# Patient Record
Sex: Female | Born: 1960 | Marital: Married | Smoking: Never smoker
Health system: Southern US, Community
[De-identification: ages and names within clinical notes are randomized; demographics above are authoritative.]

## PROBLEM LIST (undated history)

## (undated) DIAGNOSIS — M5416 Radiculopathy, lumbar region: Secondary | ICD-10-CM

## (undated) DIAGNOSIS — K5909 Other constipation: Secondary | ICD-10-CM

## (undated) DIAGNOSIS — M1990 Unspecified osteoarthritis, unspecified site: Secondary | ICD-10-CM

## (undated) DIAGNOSIS — M5136 Other intervertebral disc degeneration, lumbar region: Secondary | ICD-10-CM

## (undated) DIAGNOSIS — M810 Age-related osteoporosis without current pathological fracture: Secondary | ICD-10-CM

## (undated) DIAGNOSIS — F5104 Psychophysiologic insomnia: Secondary | ICD-10-CM

## (undated) DIAGNOSIS — M51369 Other intervertebral disc degeneration, lumbar region without mention of lumbar back pain or lower extremity pain: Secondary | ICD-10-CM

## (undated) DIAGNOSIS — E785 Hyperlipidemia, unspecified: Secondary | ICD-10-CM

## (undated) DIAGNOSIS — U071 COVID-19: Secondary | ICD-10-CM

## (undated) DIAGNOSIS — R768 Other specified abnormal immunological findings in serum: Secondary | ICD-10-CM

## (undated) DIAGNOSIS — K219 Gastro-esophageal reflux disease without esophagitis: Secondary | ICD-10-CM

## (undated) DIAGNOSIS — M5126 Other intervertebral disc displacement, lumbar region: Secondary | ICD-10-CM

## (undated) DIAGNOSIS — R059 Cough, unspecified: Secondary | ICD-10-CM

## (undated) DIAGNOSIS — N200 Calculus of kidney: Secondary | ICD-10-CM

## (undated) DIAGNOSIS — Z86010 Personal history of colonic polyps: Secondary | ICD-10-CM

## (undated) DIAGNOSIS — R053 Chronic cough: Secondary | ICD-10-CM

## (undated) HISTORY — DX: COVID-19: U07.1

## (undated) HISTORY — PX: UPPER GASTROINTESTINAL ENDOSCOPY: SHX188

## (undated) HISTORY — DX: Unspecified osteoarthritis, unspecified site: M19.90

## (undated) HISTORY — DX: Other specified abnormal immunological findings in serum: R76.8

## (undated) HISTORY — DX: Cough, unspecified: R05.9

## (undated) HISTORY — PX: MOUTH SURGERY: SHX715

## (undated) HISTORY — DX: Other intervertebral disc displacement, lumbar region: M51.26

## (undated) HISTORY — PX: WISDOM TOOTH EXTRACTION: SHX21

## (undated) HISTORY — DX: Other constipation: K59.09

## (undated) HISTORY — PX: TONSILLECTOMY: SUR1361

## (undated) HISTORY — DX: Chronic cough: R05.3

## (undated) HISTORY — DX: Other intervertebral disc degeneration, lumbar region: M51.36

## (undated) HISTORY — DX: Personal history of colonic polyps: Z86.010

## (undated) HISTORY — DX: Radiculopathy, lumbar region: M54.16

## (undated) HISTORY — PX: BREAST ENHANCEMENT SURGERY: SHX7

## (undated) HISTORY — DX: Gastro-esophageal reflux disease without esophagitis: K21.9

## (undated) HISTORY — DX: Age-related osteoporosis without current pathological fracture: M81.0

## (undated) HISTORY — DX: Hyperlipidemia, unspecified: E78.5

## (undated) HISTORY — DX: Other intervertebral disc degeneration, lumbar region without mention of lumbar back pain or lower extremity pain: M51.369

## (undated) HISTORY — DX: Psychophysiologic insomnia: F51.04

## (undated) HISTORY — DX: Calculus of kidney: N20.0

## (undated) HISTORY — PX: OTHER SURGICAL HISTORY: SHX169

---

## 2007-05-27 HISTORY — PX: BREAST ENHANCEMENT SURGERY: SHX7

## 2009-05-26 HISTORY — PX: VULVA SURGERY: SHX837

## 2012-07-22 ENCOUNTER — Encounter: Payer: Self-pay | Admitting: Internal Medicine

## 2012-08-27 ENCOUNTER — Encounter: Payer: Self-pay | Admitting: Internal Medicine

## 2014-02-27 ENCOUNTER — Encounter: Payer: Self-pay | Admitting: Internal Medicine

## 2014-04-27 ENCOUNTER — Encounter: Payer: Self-pay | Admitting: Internal Medicine

## 2014-04-27 ENCOUNTER — Ambulatory Visit (INDEPENDENT_AMBULATORY_CARE_PROVIDER_SITE_OTHER): Payer: BC Managed Care – PPO | Admitting: Internal Medicine

## 2014-04-27 VITALS — BP 120/72 | HR 72 | Ht 62.5 in | Wt 119.4 lb

## 2014-04-27 DIAGNOSIS — K5900 Constipation, unspecified: Secondary | ICD-10-CM

## 2014-04-27 DIAGNOSIS — Z1211 Encounter for screening for malignant neoplasm of colon: Secondary | ICD-10-CM

## 2014-04-27 DIAGNOSIS — K5909 Other constipation: Secondary | ICD-10-CM

## 2014-04-27 DIAGNOSIS — K6289 Other specified diseases of anus and rectum: Secondary | ICD-10-CM

## 2014-04-27 MED ORDER — MOVIPREP 100 G PO SOLR: ORAL | Status: DC

## 2014-04-27 MED ORDER — LINACLOTIDE 145 MCG PO CAPS: 145.0000 ug | ORAL_CAPSULE | Freq: Every day | ORAL | Status: DC

## 2014-04-27 NOTE — Progress Notes (Signed)
Patient ID: Cassandra Frazier, female   DOB: 1961/04/26, 53 y.o.   MRN: [id number] Faxed office note from today to [facility] in [place], fax # [phone], phone # [phone] per Dr Celesta Aver instructions.

## 2014-04-27 NOTE — Progress Notes (Signed)
Referred by Jeneen Rinks Daily, MD Subjective:    Patient ID: Cassandra Frazier, female    DOB: [birth year], 53 y.o.   MRN: [id number]  HPI Patient is a very nice middle-aged woman here because of constipation issues. She has had chronic constipation issues for many years. She says when she was a child she had diarrhea or colitis but then after the birth of her only son in 1992 she developed constipation problems. Over the years she generally got an urge to defecate but had difficulty producing a stool. She used various laxatives over time. In the late summer or fall she developed problems with a herniated disc, she was on narcotics, she underwent epidural injections which helped her disc pain but had a flare of constipation problems. She had x-rays that demonstrated a large amount of stool and it was suggested she seek GI consultation regarding her problems. Since that time she is off all narcotics but continues to use a regimen of MiraLAX nightly, senna O with stool softener and Colace at night also and then use anywhere from 15-25 glycerin suppositories to promote defecation in the morning. Stools are mushy and there is incomplete defecation sensation. Enemas do not tend to help her. When she was having problems in the fall while she was on narcotics she had a soapsuds enema at the hospital that really did not help and then took 4 L of GoLYTELY which produced some defecation but did not really am to her cause any problems either. She relates that she had a fourth degree laceration with the birth of her son and has had problems with urge incontinence at times since then and always has to be cognizant of where the bathroom is to avoid that. She travels a lot with her work out in the car and wants to avoid having to defecate at those times. She does not have any urinary symptoms. No back pain reported now. No Known Allergies No outpatient prescriptions prior to visit.   No facility-administered medications prior to  visit.   Past Medical History  Diagnosis Date  . Herniated lumbar intervertebral disc   . Acute right lumbar radiculopathy   . Degeneration of lumbar intervertebral disc   . Kidney stones   . Arthritis    Past Surgical History  Procedure Laterality Date  . Mouth surgery    . Epidural steroid injection      multiple  . Tonsillectomy    . Breast enhancement surgery     History   Social History  . Marital Status: Married    Spouse Name: N/A    Number of Children: 1       Occupational History  . Building control surveyor    Social History Main Topics  . Smoking status: Never Smoker   . Smokeless tobacco: Never Used  . Alcohol Use: 0.0 oz/week    0 Not specified per week     Comment: daily  . Drug Use: No  . 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 Rheum arthritis Mother   . Thyroid disease Mother   . Diabetes Neg Hx   . Colon cancer Neg Hx   . Colon polyps Neg Hx   . Kidney disease Neg Hx   . Esophageal cancer  Neg Hx   . ALS Father      Review of Systems All other review of systems negative or as per history of present illness    Objective:   Physical Exam General:  Well-developed, well-nourished and in no acute distress Eyes:  anicteric. ENT:   Mouth and posterior pharynx free of lesions.  Neck:   supple w/o thyromegaly or mass.  Lungs: Clear to auscultation bilaterally. Heart:  S1S2, no rubs, murmurs, gallops. Abdomen:  soft, non-tender, no hepatosplenomegaly, hernia, or mass and BS+.  Rectal:  Cassandra Frazier, Cassandra Frazier present.  Anoderm inspection - normal Anal wink was present Digital exam revealed decreased resting tone and voluntary squeeze. No mass or rectocele present. Simulated defecation with valsalva revealed appropriate abdominal contraction and reduced descent     Lymph:  no cervical or supraclavicular adenopathy. Extremities:   no edema Skin   no rash. Neuro:  A&O x 3.  Psych:  appropriate mood and  Affect.   Data Reviewed:  Pain clinic notes    Assessment & Plan:   1. Chronic constipation   2. Decreased anal sphincter tone   3. Colon cancer screening    1. She will stop her current regimen and give Linzess 145 g daily a try for her constipation problems. 2. A screening colonoscopy will be scheduled. The risks and benefits as well as alternatives of endoscopic procedure(s) have been discussed and reviewed. All questions answered. The patient agrees to proceed. 3. May need to consider further testing with anorectal manometry, possible Sitzmarks. I think her decreased sphincter tone is related to prior birth trauma, she does know about Kegel exercises and is used them, but there also seems to be some decreased distal which could suggest some pelvic floor dysfunction as well. Hopefully change in the Linzess will make a difference, if the 145 g dose does not work would go to 290 g. Hopefully that will not cause any urge incontinence issues either.  I appreciate the opportunity to care for this patient. Cc: Diego Cory, MD and Margaretha Sheffield, MD

## 2014-04-27 NOTE — Patient Instructions (Addendum)
You have been scheduled for a colonoscopy. Please follow written instructions given to you at your visit today.  Please pick up your prep kit at the pharmacy within the next 1-3 days.  Rebate provided. If you use inhalers (even only as needed), please bring them with you on the day of your procedure. Your physician has requested that you go to www.startemmi.com and enter the access code given to you at your visit today. This web site gives a general overview about your procedure. However, you should still follow specific instructions given to you by our office regarding your preparation for the procedure.   We have sent the following medications to your pharmacy for you to pick up at your convenience: Linzess, coupon provided.   Please let us know if this helps.   I appreciate the opportunity to care for you.

## 2014-05-01 ENCOUNTER — Encounter: Payer: Self-pay | Admitting: Internal Medicine

## 2014-05-11 ENCOUNTER — Encounter: Payer: BC Managed Care – PPO | Admitting: Internal Medicine

## 2014-05-26 HISTORY — PX: COLONOSCOPY: SHX174

## 2014-06-22 ENCOUNTER — Encounter: Payer: Self-pay | Admitting: Internal Medicine

## 2014-06-22 ENCOUNTER — Ambulatory Visit (AMBULATORY_SURGERY_CENTER): Payer: BLUE CROSS/BLUE SHIELD | Admitting: Internal Medicine

## 2014-06-22 VITALS — BP 112/74 | HR 63 | Temp 97.4°F | Resp 19 | Ht 62.0 in | Wt 119.0 lb

## 2014-06-22 DIAGNOSIS — Z1211 Encounter for screening for malignant neoplasm of colon: Secondary | ICD-10-CM | POA: Diagnosis not present

## 2014-06-22 DIAGNOSIS — D128 Benign neoplasm of rectum: Secondary | ICD-10-CM

## 2014-06-22 DIAGNOSIS — D129 Benign neoplasm of anus and anal canal: Secondary | ICD-10-CM | POA: Diagnosis not present

## 2014-06-22 MED ORDER — SODIUM CHLORIDE 0.9 % IV SOLN: 500.0000 mL | INTRAVENOUS | Status: DC

## 2014-06-22 NOTE — Patient Instructions (Addendum)
I found and removed one tiny polyp. Everything else looked OK.  I would like you to try taking 2 Linzess (145 ug x 2) every AM starting tomorrow.  If that is not helping please let me know. I will let you know pathology results and when to have another routine colonoscopy by mail.  I appreciate the opportunity to care for you. Gatha Mayer, MD, FACG YOU HAD AN ENDOSCOPIC PROCEDURE TODAY AT [facility]: Refer to the procedure report that was given to you for any specific questions about what was found during the examination.  If the procedure report does not answer your questions, please call your gastroenterologist to clarify.  If you requested that your care partner not be given the details of your procedure findings, then the procedure report has been included in a sealed envelope for you to review at your convenience later.  YOU SHOULD EXPECT: Some feelings of bloating in the abdomen. Passage of more gas than usual.  Walking can help get rid of the air that was put into your GI tract during the procedure and reduce the bloating. If you had a lower endoscopy (such as a colonoscopy or flexible sigmoidoscopy) you may notice spotting of blood in your stool or on the toilet paper. If you underwent a bowel prep for your procedure, then you may not have a normal bowel movement for a few days.  DIET: Your first meal following the procedure should be a light meal and then it is ok to progress to your normal diet.  A half-sandwich or bowl of soup is an example of a good first meal.  Heavy or fried foods are harder to digest and may make you feel nauseous or bloated.  Likewise meals heavy in dairy and vegetables can cause extra gas to form and this can also increase the bloating.  Drink plenty of fluids but you should avoid alcoholic beverages for 24 hours.  ACTIVITY: Your care partner should take you home directly after the procedure.  You should plan to take it easy, moving  slowly for the rest of the day.  You can resume normal activity the day after the procedure however you should NOT DRIVE or use heavy machinery for 24 hours (because of the sedation medicines used during the test).    SYMPTOMS TO REPORT IMMEDIATELY: A gastroenterologist can be reached at any hour.  During normal business hours, 8:30 AM to 5:00 PM Monday through Friday, call [phone].  After hours and on weekends, please call the GI answering service at [phone] who will take a message and have the physician on call contact you.   Following lower endoscopy (colonoscopy or flexible sigmoidoscopy):  Excessive amounts of blood in the stool  Significant tenderness or worsening of abdominal pains  Swelling of the abdomen that is new, acute  Fever of 100F or higher   FOLLOW UP: If any biopsies were taken you will be contacted by phone or by letter within the next 1-3 weeks.  Call your gastroenterologist if you have not heard about the biopsies in 3 weeks.  Our staff will call the home number listed on your records the next business day following your procedure to check on you and address any questions or concerns that you may have at that time regarding the information given to you following your procedure. This is a courtesy call and so if there is no answer at the home number and we have not heard from  you through the emergency physician on call, we will assume that you have returned to your regular daily activities without incident.  SIGNATURES/CONFIDENTIALITY: You and/or your care partner have signed paperwork which will be entered into your electronic medical record.  These signatures attest to the fact that that the information above on your After Visit Summary has been reviewed and is understood.  Full responsibility of the confidentiality of this discharge information lies with you and/or your care-partner.  Polyp-handout given

## 2014-06-22 NOTE — Progress Notes (Signed)
Called to room to assist during endoscopic procedure.  Patient ID and intended procedure confirmed with present staff. Received instructions for my participation in the procedure from the performing physician.  

## 2014-06-22 NOTE — Op Note (Signed)
[facility]  [address] [place], [id number]   COLONOSCOPY PROCEDURE REPORT  PATIENT: Cassandra Frazier, Cassandra Frazier  MR#: [id number] BIRTHDATE: Jan 14, 1961 , 85  yrs. old GENDER: female ENDOSCOPIST: Gatha Mayer, MD, [facility] PROCEDURE DATE:  06/22/2014 PROCEDURE:   Colonoscopy with biopsy First Screening Colonoscopy - Avg.  risk and is 50 yrs.  old or older Yes.  Prior Negative Screening - Now for repeat screening. N/A  History of Adenoma - Now for follow-up colonoscopy & has been > or = to 3 yrs.  N/A  Polyps Removed Today? Yes. ASA CLASS:   Class II INDICATIONS:first colonoscopy and average risk for colorectal cancer. MEDICATIONS: Propofol 300 mg IV and Monitored anesthesia care  DESCRIPTION OF PROCEDURE:   After the risks benefits and alternatives of the procedure were thoroughly explained, informed consent was obtained.  The digital rectal exam revealed no abnormalities of the rectum.   The LB [id number]  endoscope was introduced through the anus and advanced to the cecum, which was identified by both the appendix and ileocecal valve. No adverse events experienced.   The quality of the prep was good, using MoviPrep  The instrument was then slowly withdrawn as the colon was fully examined.      COLON FINDINGS: A sessile polyp measuring 2 mm in size was found in the rectum.  A polypectomy was performed with cold forceps.  The resection was complete, the polyp tissue was completely retrieved and sent to histology.   The examination was otherwise normal. Retroflexed views revealed no abnormalities. The time to cecum=6 minutes 24 seconds.  Withdrawal time=7 minutes 46 seconds.  The scope was withdrawn and the procedure completed. COMPLICATIONS: There were no immediate complications.  ENDOSCOPIC IMPRESSION: 1.   Sessile polyp measuring 2 mm in size was found in the rectum; polypectomy was performed with cold forceps 2.   The examination was otherwise normal - good  prep  RECOMMENDATIONS: 1.  Timing of repeat colonoscopy will be determined by pathology findings. 2.  Try Linzess 290 ug daily - if not helpful would most likely pursue anorectal manometry  eSigned:  Gatha Mayer, MD, Marval Regal 06/22/2014 1:58 PM   cc: Osborne Casco, MD,   Diego Cory, MD and The Patient

## 2014-06-22 NOTE — Progress Notes (Signed)
A/ox3, pleased with MAC, report to RN 

## 2014-06-23 ENCOUNTER — Telehealth: Payer: Self-pay | Admitting: *Deleted

## 2014-06-23 NOTE — Telephone Encounter (Signed)
No answer, message left for the patient. 

## 2014-06-28 ENCOUNTER — Encounter: Payer: Self-pay | Admitting: Internal Medicine

## 2014-06-28 DIAGNOSIS — Z860101 Personal history of adenomatous and serrated colon polyps: Secondary | ICD-10-CM

## 2014-06-28 DIAGNOSIS — Z86010 Personal history of colonic polyps: Secondary | ICD-10-CM

## 2014-06-28 HISTORY — DX: Personal history of colonic polyps: Z86.010

## 2014-06-28 HISTORY — DX: Personal history of adenomatous and serrated colon polyps: Z86.0101

## 2014-06-28 NOTE — Progress Notes (Signed)
Quick Note:  Rectal adenoma - diminutive - repeat colon 2021 ______

## 2015-04-06 ENCOUNTER — Other Ambulatory Visit: Payer: Self-pay | Admitting: Internal Medicine

## 2015-09-10 ENCOUNTER — Other Ambulatory Visit: Payer: Self-pay | Admitting: Internal Medicine

## 2016-01-14 ENCOUNTER — Other Ambulatory Visit: Payer: Self-pay | Admitting: Internal Medicine

## 2016-02-19 ENCOUNTER — Other Ambulatory Visit: Payer: Self-pay | Admitting: Internal Medicine

## 2016-02-19 ENCOUNTER — Telehealth: Payer: Self-pay | Admitting: Internal Medicine

## 2016-02-19 MED ORDER — LINACLOTIDE 145 MCG PO CAPS: ORAL_CAPSULE | ORAL | 0 refills | Status: DC

## 2016-02-19 NOTE — Telephone Encounter (Signed)
Linzess refill sent in as requested.

## 2016-02-25 DIAGNOSIS — G43909 Migraine, unspecified, not intractable, without status migrainosus: Secondary | ICD-10-CM | POA: Insufficient documentation

## 2016-02-25 DIAGNOSIS — R739 Hyperglycemia, unspecified: Secondary | ICD-10-CM | POA: Insufficient documentation

## 2016-02-25 DIAGNOSIS — K635 Polyp of colon: Secondary | ICD-10-CM | POA: Insufficient documentation

## 2016-02-25 DIAGNOSIS — M519 Unspecified thoracic, thoracolumbar and lumbosacral intervertebral disc disorder: Secondary | ICD-10-CM | POA: Insufficient documentation

## 2016-03-10 DIAGNOSIS — K5909 Other constipation: Secondary | ICD-10-CM | POA: Insufficient documentation

## 2018-02-08 DIAGNOSIS — F5104 Psychophysiologic insomnia: Secondary | ICD-10-CM | POA: Insufficient documentation

## 2018-02-08 DIAGNOSIS — D369 Benign neoplasm, unspecified site: Secondary | ICD-10-CM | POA: Insufficient documentation

## 2018-02-09 DIAGNOSIS — E785 Hyperlipidemia, unspecified: Secondary | ICD-10-CM | POA: Insufficient documentation

## 2019-01-20 DIAGNOSIS — Z1212 Encounter for screening for malignant neoplasm of rectum: Secondary | ICD-10-CM | POA: Diagnosis not present

## 2019-01-20 DIAGNOSIS — Z01419 Encounter for gynecological examination (general) (routine) without abnormal findings: Secondary | ICD-10-CM | POA: Diagnosis not present

## 2019-04-14 DIAGNOSIS — F5104 Psychophysiologic insomnia: Secondary | ICD-10-CM | POA: Diagnosis not present

## 2019-04-14 DIAGNOSIS — Z0000 Encounter for general adult medical examination without abnormal findings: Secondary | ICD-10-CM | POA: Diagnosis not present

## 2019-04-14 DIAGNOSIS — R739 Hyperglycemia, unspecified: Secondary | ICD-10-CM | POA: Diagnosis not present

## 2019-04-14 DIAGNOSIS — K5909 Other constipation: Secondary | ICD-10-CM | POA: Diagnosis not present

## 2019-04-18 DIAGNOSIS — Z1231 Encounter for screening mammogram for malignant neoplasm of breast: Secondary | ICD-10-CM | POA: Diagnosis not present

## 2019-04-25 DIAGNOSIS — K5909 Other constipation: Secondary | ICD-10-CM | POA: Diagnosis not present

## 2019-04-25 DIAGNOSIS — E785 Hyperlipidemia, unspecified: Secondary | ICD-10-CM | POA: Diagnosis not present

## 2019-07-21 DIAGNOSIS — Z08 Encounter for follow-up examination after completed treatment for malignant neoplasm: Secondary | ICD-10-CM | POA: Diagnosis not present

## 2019-07-21 DIAGNOSIS — L820 Inflamed seborrheic keratosis: Secondary | ICD-10-CM | POA: Diagnosis not present

## 2019-07-21 DIAGNOSIS — D225 Melanocytic nevi of trunk: Secondary | ICD-10-CM | POA: Diagnosis not present

## 2019-07-21 DIAGNOSIS — Z85828 Personal history of other malignant neoplasm of skin: Secondary | ICD-10-CM | POA: Diagnosis not present

## 2019-07-21 DIAGNOSIS — D485 Neoplasm of uncertain behavior of skin: Secondary | ICD-10-CM | POA: Diagnosis not present

## 2019-07-21 DIAGNOSIS — L821 Other seborrheic keratosis: Secondary | ICD-10-CM | POA: Diagnosis not present

## 2019-08-11 DIAGNOSIS — L988 Other specified disorders of the skin and subcutaneous tissue: Secondary | ICD-10-CM | POA: Diagnosis not present

## 2019-08-11 DIAGNOSIS — D485 Neoplasm of uncertain behavior of skin: Secondary | ICD-10-CM | POA: Diagnosis not present

## 2019-09-21 DIAGNOSIS — M25562 Pain in left knee: Secondary | ICD-10-CM | POA: Insufficient documentation

## 2019-09-23 DIAGNOSIS — M1712 Unilateral primary osteoarthritis, left knee: Secondary | ICD-10-CM | POA: Diagnosis not present

## 2019-09-23 DIAGNOSIS — M25562 Pain in left knee: Secondary | ICD-10-CM | POA: Diagnosis not present

## 2019-11-02 DIAGNOSIS — J3089 Other allergic rhinitis: Secondary | ICD-10-CM | POA: Diagnosis not present

## 2019-11-08 DIAGNOSIS — J0120 Acute ethmoidal sinusitis, unspecified: Secondary | ICD-10-CM | POA: Diagnosis not present

## 2019-11-08 DIAGNOSIS — R519 Headache, unspecified: Secondary | ICD-10-CM | POA: Diagnosis not present

## 2019-11-08 DIAGNOSIS — G8929 Other chronic pain: Secondary | ICD-10-CM | POA: Diagnosis not present

## 2019-11-21 DIAGNOSIS — J0120 Acute ethmoidal sinusitis, unspecified: Secondary | ICD-10-CM | POA: Diagnosis not present

## 2019-11-21 DIAGNOSIS — R519 Headache, unspecified: Secondary | ICD-10-CM | POA: Diagnosis not present

## 2019-12-01 DIAGNOSIS — R519 Headache, unspecified: Secondary | ICD-10-CM | POA: Diagnosis not present

## 2019-12-05 DIAGNOSIS — H02825 Cysts of left lower eyelid: Secondary | ICD-10-CM | POA: Diagnosis not present

## 2019-12-07 DIAGNOSIS — R5383 Other fatigue: Secondary | ICD-10-CM | POA: Diagnosis not present

## 2019-12-07 DIAGNOSIS — R05 Cough: Secondary | ICD-10-CM | POA: Diagnosis not present

## 2019-12-14 ENCOUNTER — Encounter: Payer: Self-pay | Admitting: Neurology

## 2020-01-11 DIAGNOSIS — Z0184 Encounter for antibody response examination: Secondary | ICD-10-CM | POA: Diagnosis not present

## 2020-02-07 ENCOUNTER — Other Ambulatory Visit: Payer: Self-pay

## 2020-02-07 ENCOUNTER — Ambulatory Visit: Payer: BC Managed Care – PPO | Admitting: Pulmonary Disease

## 2020-02-07 ENCOUNTER — Encounter: Payer: Self-pay | Admitting: Pulmonary Disease

## 2020-02-07 VITALS — BP 120/82 | HR 76 | Temp 98.3°F | Ht 64.0 in | Wt 123.6 lb

## 2020-02-07 DIAGNOSIS — R05 Cough: Secondary | ICD-10-CM

## 2020-02-07 DIAGNOSIS — R059 Cough, unspecified: Secondary | ICD-10-CM

## 2020-02-07 LAB — CBC WITH DIFFERENTIAL/PLATELET
Basophils Absolute: 0.1 10*3/uL (ref 0.0–0.1)
Basophils Relative: 0.8 % (ref 0.0–3.0)
Eosinophils Absolute: 0.1 10*3/uL (ref 0.0–0.7)
Eosinophils Relative: 1.7 % (ref 0.0–5.0)
HCT: 42.3 % (ref 36.0–46.0)
Hemoglobin: 14 g/dL (ref 12.0–15.0)
Lymphocytes Relative: 34.2 % (ref 12.0–46.0)
Lymphs Abs: 2.2 10*3/uL (ref 0.7–4.0)
MCHC: 33 g/dL (ref 30.0–36.0)
MCV: 87.7 fl (ref 78.0–100.0)
Monocytes Absolute: 0.5 10*3/uL (ref 0.1–1.0)
Monocytes Relative: 8.2 % (ref 3.0–12.0)
Neutro Abs: 3.6 10*3/uL (ref 1.4–7.7)
Neutrophils Relative %: 55.1 % (ref 43.0–77.0)
Platelets: 332 10*3/uL (ref 150.0–400.0)
RBC: 4.82 Mil/uL (ref 3.87–5.11)
RDW: 12.6 % (ref 11.5–15.5)
WBC: 6.5 10*3/uL (ref 4.0–10.5)

## 2020-02-07 LAB — COMPREHENSIVE METABOLIC PANEL
ALT: 22 U/L (ref 0–35)
AST: 17 U/L (ref 0–37)
Albumin: 4.8 g/dL (ref 3.5–5.2)
Alkaline Phosphatase: 65 U/L (ref 39–117)
BUN: 17 mg/dL (ref 6–23)
CO2: 29 mEq/L (ref 19–32)
Calcium: 9.8 mg/dL (ref 8.4–10.5)
Chloride: 100 mEq/L (ref 96–112)
Creatinine, Ser: 0.59 mg/dL (ref 0.40–1.20)
GFR: 104.11 mL/min (ref >60.00)
Glucose, Bld: 89 mg/dL (ref 70–99)
Potassium: 3.9 mEq/L (ref 3.5–5.1)
Sodium: 138 mEq/L (ref 135–145)
Total Bilirubin: 1.4 mg/dL — ABNORMAL HIGH (ref 0.2–1.2)
Total Protein: 7.2 g/dL (ref 6.0–8.3)

## 2020-02-07 NOTE — Patient Instructions (Signed)
We will get labs today including complete metabolic panel, CBC differential, IgE Start Flonase nasal spray Use chlorpheniramine 8 mg 3 times daily.  This medication is available over-the-counter  We will also start Prilosec 20 mg a day for acid reflux Schedule high-res CT for evaluation of cough and pulmonary function test  Follow-up in 1 to 2 months

## 2020-02-07 NOTE — Progress Notes (Signed)
Cassandra Frazier    [id number]    17-Sep-1960  Primary Care Physician:Pomposini, Cherly Anderson, MD  Referring Physician: Clinton Quant, MD No address on file  Chief complaint: Consult for cough  HPI: 59 year old complains of chronic cough for 4 months Symptoms started in May 2021 with sinus congestion, headache.  She was evaluated by ENT and primary care and given multiple rounds of antibiotic and prednisone.  CT sinuses were negative. She subsequently had IgG tested for COVID-19 which is positive and thinks that she may have contracted Covid in May.  All test done in [place], [place] and not available to review  Has chronic cough.  Denies any sinus congestion, nose drainage.  Denies any acid reflux  Pets: Has a dog Occupation: Music therapist Exposures: No known exposures.  No mold, hot tub, Jacuzzi.  No feather pillows or comforter Smoking history: Never smoker Travel history: No significant travel history Relevant family history: No family history of lung disease   Outpatient Encounter Medications as of 02/07/2020  Medication Sig  . benzonatate (TESSALON) 100 MG capsule Take by mouth 3 (three) times daily as needed for cough.  . [DISCONTINUED] linaclotide (LINZESS) 145 MCG CAPS capsule TAKE 1 CAPSULE BY MOUTH 30 MINUTES BEFORE BREAKFAST   No facility-administered encounter medications on file as of 02/07/2020.    Allergies as of 02/07/2020  . (No Known Allergies)    Past Medical History:  Diagnosis Date  . Acute right lumbar radiculopathy   . Arthritis   . Degeneration of lumbar intervertebral disc   . Herniated lumbar intervertebral disc   . Hx of adenomatous polyp of rectum 06/28/2014  . Kidney stones     Past Surgical History:  Procedure Laterality Date  . BREAST ENHANCEMENT SURGERY    . epidural steroid injection     multiple  . MOUTH SURGERY    . TONSILLECTOMY      Family History  Problem Relation Age of Onset  . Rheum arthritis Mother   .  Thyroid disease Mother   . ALS Father   . Diabetes Neg Hx   . Colon cancer Neg Hx   . Colon polyps Neg Hx   . Kidney disease Neg Hx   . Esophageal cancer Neg Hx     Social History   Socioeconomic History  . Marital status: Married    Spouse name: Not on file  . Number of children: 1  . Years of education: Not on file  . Highest education level: Not on file  Occupational History  . Occupation: Building control surveyor  Tobacco Use  . Smoking status: Never Smoker  . Smokeless tobacco: Never Used  Substance and Sexual Activity  . Alcohol use: Yes    Alcohol/week: 0.0 standard drinks    Comment: daily  . Drug use: No  . Sexual activity: Not on file  Other Topics Concern  . 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 Difficulty of Paying Living Expenses: Not on file  Food Insecurity:   . Worried About Charity fundraiser in the Last Year: Not on file  . Ran Out of Food in the Last Year: Not on file  Transportation Needs:   . Lack of Transportation (Medical): Not on file  .  Lack of Transportation (Non-Medical): Not on file  Physical Activity:   . Days of Exercise per Week: Not on file  . Minutes of Exercise per Session: Not on file  Stress:   . Feeling of Stress : Not on file  Social Connections:   . Frequency of Communication with Friends and Family: Not on file  . Frequency of Social Gatherings with Friends and Family: Not on file  . Attends Religious Services: Not on file  . Active Member of Clubs or Organizations: Not on file  . Attends Archivist Meetings: Not on file  . Marital Status: Not on file  Intimate Partner Violence:   . Fear of Current or Ex-Partner: Not on file  . Emotionally Abused: Not on file  . Physically Abused: Not on file  . Sexually Abused: Not on file     Review of systems: Review of Systems  Constitutional: Negative for fever and chills.  HENT: Negative.   Eyes: Negative for blurred vision.  Respiratory: as per HPI  Cardiovascular: Negative for chest pain and palpitations.  Gastrointestinal: Negative for vomiting, diarrhea, blood per rectum. Genitourinary: Negative for dysuria, urgency, frequency and hematuria.  Musculoskeletal: Negative for myalgias, back pain and joint pain.  Skin: Negative for itching and rash.  Neurological: Negative for dizziness, tremors, focal weakness, seizures and loss of consciousness.  Endo/Heme/Allergies: Negative for environmental allergies.  Psychiatric/Behavioral: Negative for depression, suicidal ideas and hallucinations.  All other systems reviewed and are negative.  Physical Exam: Blood pressure 120/82, pulse 76, temperature 98.3 F (36.8 C), temperature source Oral, height 5\' 4"  (1.626 m), weight 123 lb 9.6 oz (56.1 kg), SpO2 99 %. Gen:      No acute distress HEENT:  EOMI, sclera anicteric Neck:     No masses; no thyromegaly Lungs:    Clear to auscultation bilaterally; normal respiratory effort CV:         Regular rate and rhythm; no murmurs Abd:      + bowel sounds; soft, non-tender; no palpable masses, no distension Ext:    No edema; adequate peripheral perfusion Skin:      Warm and dry; no rash Neuro: alert and oriented x 3 Psych: normal mood and affect  Data Reviewed: Imaging:  PFTs:  Labs:  Assessment:  Chronic cough Likely upper airway cough from postnasal drip and silent reflux With positive antibody she may have had a Covid infection infection in the past  We will check baseline labs including metabolic panel, CBC and IgE Start Flonase and chlorpheniramine for postnasal drip Prilosec for empiric treatment of acid reflux  Schedule high-res CT and PFTs for evaluation of the lung  Plan/Recommendations: Metabolic panel, CBC, IgE Flonase, chlorpheniramine, Prilosec HRCT,  PFTs  Marshell Garfinkel MD [facility] 02/07/2020, 2:39 PM  CC: Pomposini, Cherly Anderson, MD

## 2020-02-07 NOTE — Addendum Note (Signed)
Addended by: Suzzanne Cloud E on: 02/07/2020 03:12 PM   Modules accepted: Orders

## 2020-02-08 LAB — IGE: IgE (Immunoglobulin E), Serum: 10 kU/L (ref <=114)

## 2020-02-29 ENCOUNTER — Ambulatory Visit (HOSPITAL_COMMUNITY): Payer: BC Managed Care – PPO

## 2020-03-01 ENCOUNTER — Ambulatory Visit (HOSPITAL_COMMUNITY)
Admission: RE | Admit: 2020-03-01 | Discharge: 2020-03-01 | Disposition: A | Discharge status: Home / Self Care | Payer: BC Managed Care – PPO | Source: Ambulatory Visit | Attending: Pulmonary Disease | Admitting: Pulmonary Disease

## 2020-03-01 ENCOUNTER — Other Ambulatory Visit: Payer: Self-pay

## 2020-03-01 DIAGNOSIS — R059 Cough, unspecified: Secondary | ICD-10-CM | POA: Diagnosis not present

## 2020-03-01 DIAGNOSIS — R5383 Other fatigue: Secondary | ICD-10-CM | POA: Diagnosis not present

## 2020-03-01 DIAGNOSIS — R911 Solitary pulmonary nodule: Secondary | ICD-10-CM | POA: Diagnosis not present

## 2020-03-01 DIAGNOSIS — R918 Other nonspecific abnormal finding of lung field: Secondary | ICD-10-CM | POA: Diagnosis not present

## 2020-03-12 ENCOUNTER — Ambulatory Visit: Payer: BLUE CROSS/BLUE SHIELD | Admitting: Neurology

## 2020-03-16 ENCOUNTER — Ambulatory Visit (INDEPENDENT_AMBULATORY_CARE_PROVIDER_SITE_OTHER): Payer: BC Managed Care – PPO | Admitting: Pulmonary Disease

## 2020-03-16 ENCOUNTER — Other Ambulatory Visit: Payer: Self-pay

## 2020-03-16 ENCOUNTER — Encounter: Payer: Self-pay | Admitting: Pulmonary Disease

## 2020-03-16 ENCOUNTER — Ambulatory Visit: Payer: BC Managed Care – PPO | Admitting: Pulmonary Disease

## 2020-03-16 VITALS — BP 108/64 | HR 90 | Temp 97.3°F | Ht 62.0 in | Wt 122.0 lb

## 2020-03-16 DIAGNOSIS — R059 Cough, unspecified: Secondary | ICD-10-CM | POA: Diagnosis not present

## 2020-03-16 LAB — COMPREHENSIVE METABOLIC PANEL
ALT: 22 U/L (ref 0–35)
AST: 19 U/L (ref 0–37)
Albumin: 4.6 g/dL (ref 3.5–5.2)
Alkaline Phosphatase: 63 U/L (ref 39–117)
BUN: 16 mg/dL (ref 6–23)
CO2: 30 mEq/L (ref 19–32)
Calcium: 9.6 mg/dL (ref 8.4–10.5)
Chloride: 103 mEq/L (ref 96–112)
Creatinine, Ser: 0.65 mg/dL (ref 0.40–1.20)
GFR: 96.18 mL/min (ref >60.00)
Glucose, Bld: 93 mg/dL (ref 70–99)
Potassium: 4.2 mEq/L (ref 3.5–5.1)
Sodium: 139 mEq/L (ref 135–145)
Total Bilirubin: 1.2 mg/dL (ref 0.2–1.2)
Total Protein: 6.6 g/dL (ref 6.0–8.3)

## 2020-03-16 LAB — PULMONARY FUNCTION TEST
DL/VA % pred: 119 %
DL/VA: 5.12 ml/min/mmHg/L
DLCO cor % pred: 106 %
DLCO cor: 20.16 ml/min/mmHg
DLCO unc % pred: 108 %
DLCO unc: 20.52 ml/min/mmHg
FEF 25-75 Post: 2.74 L/sec
FEF 25-75 Pre: 2.34 L/sec
FEF2575-%Change-Post: 16 %
FEF2575-%Pred-Post: 120 %
FEF2575-%Pred-Pre: 102 %
FEV1-%Change-Post: 2 %
FEV1-%Pred-Post: 102 %
FEV1-%Pred-Pre: 99 %
FEV1-Post: 2.44 L
FEV1-Pre: 2.37 L
FEV1FVC-%Change-Post: 3 %
FEV1FVC-%Pred-Pre: 104 %
FEV6-%Change-Post: 0 %
FEV6-%Pred-Post: 96 %
FEV6-%Pred-Pre: 97 %
FEV6-Post: 2.89 L
FEV6-Pre: 2.91 L
FEV6FVC-%Pred-Post: 103 %
FEV6FVC-%Pred-Pre: 103 %
FVC-%Change-Post: 0 %
FVC-%Pred-Post: 93 %
FVC-%Pred-Pre: 94 %
FVC-Post: 2.89 L
FVC-Pre: 2.91 L
Post FEV1/FVC ratio: 85 %
Post FEV6/FVC ratio: 100 %
Pre FEV1/FVC ratio: 82 %
Pre FEV6/FVC Ratio: 100 %
RV % pred: 97 %
RV: 1.82 L
TLC % pred: 96 %
TLC: 4.6 L

## 2020-03-16 LAB — HEPATIC FUNCTION PANEL
ALT: 22 U/L (ref 0–35)
AST: 19 U/L (ref 0–37)
Albumin: 4.6 g/dL (ref 3.5–5.2)
Alkaline Phosphatase: 63 U/L (ref 39–117)
Bilirubin, Direct: 0.1 mg/dL (ref 0.0–0.3)
Total Bilirubin: 1.2 mg/dL (ref 0.2–1.2)
Total Protein: 6.6 g/dL (ref 6.0–8.3)

## 2020-03-16 NOTE — Progress Notes (Signed)
         Cassandra Frazier    [id number]    1961/02/12  Primary Care Physician:Pomposini, Cherly Anderson, MD  Referring Physician: Clinton Quant, MD No address on file  Chief complaint: Follow-up for cough  HPI: 59 year old complains of chronic cough for 4 months Symptoms started in May 2021 with sinus congestion, headache.  She was evaluated by ENT and primary care and given multiple rounds of antibiotic and prednisone.  CT sinuses were negative. She subsequently had IgG tested for COVID-19 which is positive and thinks that she may have contracted Covid in May.  All test done in [place], [place] and not available to review  Has chronic cough.  Denies any sinus congestion, nose drainage.  Denies any acid reflux  Pets: Has a dog Occupation: Music therapist Exposures: No known exposures.  No mold, hot tub, Jacuzzi.  No feather pillows or comforter Smoking history: Never smoker Travel history: No significant travel history Relevant family history: No family history of lung disease.  Interim history: Continues to have intermittent cough.  No dyspnea Here for review of CT scan and PFTs.  She is inquiring if her cough is neurogenic  Outpatient Encounter Medications as of 03/16/2020  Medication Sig  . [DISCONTINUED] benzonatate (TESSALON) 100 MG capsule Take by mouth 3 (three) times daily as needed for cough.   No facility-administered encounter medications on file as of 03/16/2020.   Physical Exam: Blood pressure 108/64, pulse 90, temperature (!) 97.3 F (36.3 C), temperature source Skin, height 5\' 2"  (1.575 m), weight 122 lb (55.3 kg), SpO2 96 %. Gen:      No acute distress HEENT:  EOMI, sclera anicteric Neck:     No masses; no thyromegaly Lungs:    Clear to auscultation bilaterally; normal respiratory effort CV:         Regular rate and rhythm; no murmurs Abd:      + bowel sounds; soft, non-tender; no palpable masses, no distension Ext:    No edema; adequate peripheral  perfusion Skin:      Warm and dry; no rash Neuro: alert and oriented x 3 Psych: normal mood and affect  Data Reviewed: Imaging: CT high-resolution 03/01/2020-no interstitial lung disease.  No air trapping.  3 mm pulmonary nodule in the left upper lobe. I have reviewed the images personally.  PFTs: 03/16/2020 FVC 2.89 [93%], FEV1 2.44 [92%] F/F 85, TLC 4.60 [96%], DLCO 20.52 [190%] Normal test  Labs: CBC 02/07/2020-WBC 6.5, eos 1.7%, absolute eosinophil count 110 IgE 02/07/2019 1-10  Assessment:  Chronic cough Likely upper airway cough from postnasal drip and silent reflux With positive antibody she may have had a Covid infection infection in the past though review of CT does not show any evidence of interstitial lung disease  Labs and PFTs are normal with no evidence of asthma or allergies  Continue Flonase and chlorpheniramine for postnasal drip Prilosec for empiric treatment of acid reflux  If cough is persistent then consider gabapentin for neurogenic cough at return visit.  Elevated bilirubin Noted at last lab test.  Values are borderline elevated We will check repeat labs today with fractionated bilirubin.  Plan/Recommendations: Flonase, chlorpheniramine, Prilosec Liver test with fractionated bilirubin  Marshell Garfinkel MD [facility] 03/16/2020, 11:14 AM  CC: Pomposini, Cherly Anderson, MD

## 2020-03-16 NOTE — Patient Instructions (Signed)
I have reviewed your pulmonary function test which show normal lung function Your CT scan does not show any interstitial lung disease.  The tiny lung nodule still likely benign Continue the nasal spray, use chlorpheniramine 8 mg three times daily. Continue the Prilosec for GERD  Follow-up in 6 months.

## 2020-03-16 NOTE — Progress Notes (Signed)
PFT done today. 

## 2020-03-17 LAB — BILIRUBIN, FRACTIONATED(TOT/DIR/INDIR)
Bilirubin, Direct: 0.2 mg/dL (ref 0.0–0.2)
Indirect Bilirubin: 1 mg/dL (calc) (ref 0.2–1.2)
Total Bilirubin: 1.2 mg/dL (ref 0.2–1.2)

## 2020-04-13 DIAGNOSIS — Z1151 Encounter for screening for human papillomavirus (HPV): Secondary | ICD-10-CM | POA: Diagnosis not present

## 2020-04-13 DIAGNOSIS — Z1212 Encounter for screening for malignant neoplasm of rectum: Secondary | ICD-10-CM | POA: Diagnosis not present

## 2020-04-13 DIAGNOSIS — Z01419 Encounter for gynecological examination (general) (routine) without abnormal findings: Secondary | ICD-10-CM | POA: Diagnosis not present

## 2020-04-30 DIAGNOSIS — Z1231 Encounter for screening mammogram for malignant neoplasm of breast: Secondary | ICD-10-CM | POA: Diagnosis not present

## 2020-06-18 DIAGNOSIS — R911 Solitary pulmonary nodule: Secondary | ICD-10-CM | POA: Insufficient documentation

## 2020-06-18 DIAGNOSIS — R202 Paresthesia of skin: Secondary | ICD-10-CM | POA: Diagnosis not present

## 2020-06-18 DIAGNOSIS — K5909 Other constipation: Secondary | ICD-10-CM | POA: Diagnosis not present

## 2020-06-18 DIAGNOSIS — F5104 Psychophysiologic insomnia: Secondary | ICD-10-CM | POA: Diagnosis not present

## 2020-06-18 DIAGNOSIS — Z8616 Personal history of COVID-19: Secondary | ICD-10-CM | POA: Insufficient documentation

## 2020-06-18 DIAGNOSIS — Z0000 Encounter for general adult medical examination without abnormal findings: Secondary | ICD-10-CM | POA: Diagnosis not present

## 2020-06-18 DIAGNOSIS — R053 Chronic cough: Secondary | ICD-10-CM | POA: Diagnosis not present

## 2020-07-15 DIAGNOSIS — L03116 Cellulitis of left lower limb: Secondary | ICD-10-CM | POA: Diagnosis not present

## 2020-07-15 DIAGNOSIS — S91302A Unspecified open wound, left foot, initial encounter: Secondary | ICD-10-CM | POA: Diagnosis not present

## 2020-08-13 DIAGNOSIS — H04123 Dry eye syndrome of bilateral lacrimal glands: Secondary | ICD-10-CM | POA: Diagnosis not present

## 2020-08-17 DIAGNOSIS — H16233 Neurotrophic keratoconjunctivitis, bilateral: Secondary | ICD-10-CM | POA: Diagnosis not present

## 2020-08-31 DIAGNOSIS — H16233 Neurotrophic keratoconjunctivitis, bilateral: Secondary | ICD-10-CM | POA: Diagnosis not present

## 2020-09-21 DIAGNOSIS — H0288A Meibomian gland dysfunction right eye, upper and lower eyelids: Secondary | ICD-10-CM | POA: Diagnosis not present

## 2020-10-01 DIAGNOSIS — F4323 Adjustment disorder with mixed anxiety and depressed mood: Secondary | ICD-10-CM | POA: Diagnosis not present

## 2020-10-03 ENCOUNTER — Encounter: Payer: Self-pay | Admitting: Nurse Practitioner

## 2020-10-04 DIAGNOSIS — D485 Neoplasm of uncertain behavior of skin: Secondary | ICD-10-CM | POA: Diagnosis not present

## 2020-10-04 DIAGNOSIS — L570 Actinic keratosis: Secondary | ICD-10-CM | POA: Diagnosis not present

## 2020-10-04 DIAGNOSIS — L905 Scar conditions and fibrosis of skin: Secondary | ICD-10-CM | POA: Diagnosis not present

## 2020-10-04 DIAGNOSIS — Z1283 Encounter for screening for malignant neoplasm of skin: Secondary | ICD-10-CM | POA: Diagnosis not present

## 2020-10-04 DIAGNOSIS — D225 Melanocytic nevi of trunk: Secondary | ICD-10-CM | POA: Diagnosis not present

## 2020-10-10 DIAGNOSIS — L03119 Cellulitis of unspecified part of limb: Secondary | ICD-10-CM | POA: Diagnosis not present

## 2020-10-10 DIAGNOSIS — L304 Erythema intertrigo: Secondary | ICD-10-CM | POA: Diagnosis not present

## 2020-10-23 DIAGNOSIS — F4323 Adjustment disorder with mixed anxiety and depressed mood: Secondary | ICD-10-CM | POA: Diagnosis not present

## 2020-10-25 ENCOUNTER — Ambulatory Visit: Payer: BC Managed Care – PPO | Admitting: Nurse Practitioner

## 2020-10-26 ENCOUNTER — Encounter: Payer: Self-pay | Admitting: Nurse Practitioner

## 2020-10-26 ENCOUNTER — Ambulatory Visit: Payer: BC Managed Care – PPO | Admitting: Nurse Practitioner

## 2020-10-26 VITALS — BP 106/70 | HR 66 | Ht 63.0 in | Wt 122.0 lb

## 2020-10-26 DIAGNOSIS — Z860100 Personal history of colon polyps, unspecified: Secondary | ICD-10-CM

## 2020-10-26 DIAGNOSIS — R059 Cough, unspecified: Secondary | ICD-10-CM

## 2020-10-26 DIAGNOSIS — K5909 Other constipation: Secondary | ICD-10-CM | POA: Diagnosis not present

## 2020-10-26 MED ORDER — PANTOPRAZOLE SODIUM 40 MG PO TBEC: 40.0000 mg | DELAYED_RELEASE_TABLET | Freq: Every day | ORAL | 3 refills | Status: DC

## 2020-10-26 NOTE — Patient Instructions (Signed)
We have sent the following medications to your pharmacy for you to pick up at your convenience: Pantoprazole 40 mg 30-60 minutes before breakfast.   Call with an update in 3 weeks, ask for Beth.   If you are age 60 or older, your body mass index should be between 23-30. Your Body mass index is 21.61 kg/m. If this is out of the aforementioned range listed, please consider follow up with your Primary Care Provider.  If you are age 67 or younger, your body mass index should be between 19-25. Your Body mass index is 21.61 kg/m. If this is out of the aformentioned range listed, please consider follow up with your Primary Care Provider.   __________________________________________________________  The [place] [facility] providers would like to encourage you to use [facility] to communicate with providers for non-urgent requests or questions.  Due to long hold times on the telephone, sending your provider a message by [facility] may be a faster and more efficient way to get a response.  Please allow 48 business hours for a response.  Please remember that this is for non-urgent requests.

## 2020-10-26 NOTE — Progress Notes (Signed)
ASSESSMENT AND PLAN    #60 year old female with cough (mainly nonproductive) x 1 year.  Initially cough was accompanied by headaches.  Over the last year she has been treated empirically for sinus infection ( ENT).  She was evaluated by Pulmonary, chest CT scan okay and patient started on OTC Prilosec.  No improvement on PPI but only took it for 2 weeks.  Patient describes globus with frequent throat clearing.  Her cough may in fact be related to acid reflux.  -- Trial of pantoprazole 40 mg 30 minutes before breakfast.  -- Antireflux measures discussed.  -- Patient will call me in a few weeks with a condition update.    #Chronic constipation with lack of urge as well as difficult evacuation. For years she has used relied on multiple suppositories Q am to have a BM. Functional outlet obstruction? Pelvic floor physical therapy might be beneficial, she will think about    #History of a small tubular adenoma January 2016.  Colonoscopy recall date changed from 2021 to 2023 based on new polyp surveillance guidelines  HISTORY OF PRESENT ILLNESS     Chief Complaint : cough, ? GERD  Cassandra Frazier is a 59 y.o. female from [place] known to Dr. Carlean Frazier from screening colonoscopy.. Patient has a past medical history significant for small adenomatous colon polyp.  See PMH below for any additional history.   In June 2021 Cassandra Frazier developed a cough ( mostly non-productive), headache and fatigue. Went to UC, not tested for COVID but given antibiotics for presumed sinus infection. She completed antibiotcs without improvement. She then went to an ENT who prescribed Augmentin for presumed sinus infection. She didn't improved,  had sinus CT scan which was reportedly normal . She continued to have headaches and dry cough so saw an ophthalmologist and was told that her eyes were okay. Then in July she saw PCP who prescribed Tessalon Perles for cough .   Patient had some concerns about COVID as cause  for her symptoms. In August she got was tested and found to have the antibodies. She subsequently got Moderna vaccines due to presumed history of COVID patient wanted to make sure that her lungs were okay, especially with the coughing. She saw Pulmonary ( Dr. Vaughan Frazier) Chest CT scan was normal except for tiny lung nodules felt to be benign . He recommended she take Prilosec for cough. She took OTC Prilosec for two weeks without any improvement in cough.   She continues to cough,  especially at night or if she  Eats something cold. Her headaches have resolved  Patient says that she has only had heartburn a few times in her life. She describes globus with frequent throat clearing. Her weight has been stable.    PREVIOUS EVALUATIONS:   January 2016 screening colonoscopy Complete exam, good prep.  A 2 mm rectal polyp was removed.  Path compatible with tubular adenoma.    Past Medical History:  Diagnosis Date  . Acute right lumbar radiculopathy   . Arthritis   . Degeneration of lumbar intervertebral disc   . Herniated lumbar intervertebral disc   . Hx of adenomatous polyp of rectum 06/28/2014  . Kidney stones      Past Surgical History:  Procedure Laterality Date  . BREAST ENHANCEMENT SURGERY    . epidural steroid injection     multiple  . MOUTH SURGERY    . TONSILLECTOMY     Family History  Problem Relation Age of Onset  .  Rheum arthritis Mother   . Thyroid disease Mother   . ALS Father   . Diabetes Neg Hx   . Colon cancer Neg Hx   . Colon polyps Neg Hx   . Kidney disease Neg Hx   . Esophageal cancer Neg Hx    Social History   Tobacco Use  . Smoking status: Never Smoker  . Smokeless tobacco: Never Used  Substance Use Topics  . Alcohol use: Yes    Alcohol/week: 0.0 standard drinks    Comment: daily  . Drug use: No   Current Outpatient Medications  Medication Sig Dispense Refill  . benzonatate (TESSALON) 100 MG capsule Take by mouth 2 (two) times daily as needed for cough.     . Glycerin, Laxative, (FLEET LIQUID GLYCERIN SUPP RE) Place 12 suppositories rectally daily.    . Pseudoeph-Doxylamine-DM-APAP (NYQUIL PO) Take by mouth as needed.     No current facility-administered medications for this visit.   No Known Allergies   Review of Systems: Positive for arthritis, back pain, cough, sleeping problems.  All other systems reviewed and negative except where noted in HPI.    PHYSICAL EXAM :    Wt Readings from Last 3 Encounters:  10/26/20 122 lb (55.3 kg)  03/16/20 122 lb (55.3 kg)  02/07/20 123 lb 9.6 oz (56.1 kg)    BP 106/70   Pulse 66   Ht 5\' 3"  (1.6 m)   Wt 122 lb (55.3 kg)   LMP 04/04/2014   SpO2 98%   BMI 21.61 kg/m  Constitutional:  Pleasant female in no acute distress. Psychiatric: Normal mood and affect. Behavior is normal. EENT: Pupils normal.  Conjunctivae are normal. No scleral icterus. Neck supple.  Cardiovascular: Normal rate, regular rhythm. No edema Pulmonary/chest: Effort normal and breath sounds normal. No wheezing, rales or rhonchi. Abdominal: Soft, nondistended, nontender. Bowel sounds active throughout. There are no masses palpable. No hepatomegaly. Neurological: Alert and oriented to person place and time. Skin: Skin is warm and dry. No rashes noted.  Cassandra Savoy, NP  10/26/2020, 11:52 AM

## 2020-11-01 DIAGNOSIS — L988 Other specified disorders of the skin and subcutaneous tissue: Secondary | ICD-10-CM | POA: Diagnosis not present

## 2020-11-01 DIAGNOSIS — D485 Neoplasm of uncertain behavior of skin: Secondary | ICD-10-CM | POA: Diagnosis not present

## 2020-11-02 DIAGNOSIS — H0288A Meibomian gland dysfunction right eye, upper and lower eyelids: Secondary | ICD-10-CM | POA: Diagnosis not present

## 2020-11-08 DIAGNOSIS — M79676 Pain in unspecified toe(s): Secondary | ICD-10-CM | POA: Diagnosis not present

## 2020-11-08 DIAGNOSIS — L988 Other specified disorders of the skin and subcutaneous tissue: Secondary | ICD-10-CM | POA: Diagnosis not present

## 2020-11-08 DIAGNOSIS — M205X1 Other deformities of toe(s) (acquired), right foot: Secondary | ICD-10-CM | POA: Diagnosis not present

## 2020-11-08 DIAGNOSIS — M205X2 Other deformities of toe(s) (acquired), left foot: Secondary | ICD-10-CM | POA: Diagnosis not present

## 2020-11-11 DIAGNOSIS — L03115 Cellulitis of right lower limb: Secondary | ICD-10-CM | POA: Diagnosis not present

## 2020-11-11 DIAGNOSIS — L304 Erythema intertrigo: Secondary | ICD-10-CM | POA: Diagnosis not present

## 2020-11-13 DIAGNOSIS — L988 Other specified disorders of the skin and subcutaneous tissue: Secondary | ICD-10-CM | POA: Diagnosis not present

## 2020-11-13 DIAGNOSIS — M205X1 Other deformities of toe(s) (acquired), right foot: Secondary | ICD-10-CM | POA: Diagnosis not present

## 2020-11-13 DIAGNOSIS — L84 Corns and callosities: Secondary | ICD-10-CM | POA: Diagnosis not present

## 2020-11-20 ENCOUNTER — Telehealth: Payer: Self-pay | Admitting: Nurse Practitioner

## 2020-11-20 DIAGNOSIS — F4323 Adjustment disorder with mixed anxiety and depressed mood: Secondary | ICD-10-CM | POA: Diagnosis not present

## 2020-11-20 NOTE — Telephone Encounter (Signed)
Called the patient back. No answer. Left her a message on her voicemail to call again.

## 2020-11-20 NOTE — Telephone Encounter (Signed)
Inbound call from patient. States she still have the cough and was told to call back if didn't improve which it is not. Best contact number [phone]

## 2020-11-21 DIAGNOSIS — R202 Paresthesia of skin: Secondary | ICD-10-CM | POA: Diagnosis not present

## 2020-11-23 NOTE — Telephone Encounter (Signed)
Spoke with the patient. States her cough persists despite pantoprazole. She denies any changes, not worsening or getting any better. Extremely frustrated. Coughs and feels like she is trying to move "something out of my upper chest" and cannot cough it out. Remains afebrile

## 2020-11-27 NOTE — Telephone Encounter (Signed)
Discussed with the patient. She is agreeable to this. EGD 12/25/20 and pre-visit 12/12/20 by telephone. She is not on blood thinners, oxygen or diet medications. States her weight is approximately 120 lbs. Offered to be wait listed. Declines.

## 2020-12-12 ENCOUNTER — Other Ambulatory Visit: Payer: Self-pay

## 2020-12-12 ENCOUNTER — Ambulatory Visit (AMBULATORY_SURGERY_CENTER): Payer: BC Managed Care – PPO

## 2020-12-12 VITALS — Ht 63.0 in | Wt 119.0 lb

## 2020-12-12 DIAGNOSIS — R059 Cough, unspecified: Secondary | ICD-10-CM

## 2020-12-12 DIAGNOSIS — K219 Gastro-esophageal reflux disease without esophagitis: Secondary | ICD-10-CM

## 2020-12-12 NOTE — Progress Notes (Signed)
Pre visit completed via phone call;  Patient verified name, DOB, and address; No egg or soy allergy known to patient  No issues with past sedation with any surgeries or procedures-- patient does report she was "hard to wake up";  Patient denies ever being told they had issues or difficulty with intubation  No FH of Malignant Hyperthermia No diet pills per patient No home 02 use per patient  No blood thinners per patient  Pt reports issues with constipation - uses fleet suppositories daily to assist  No A fib or A flutter  EMMI video via [place] 19 guidelines implemented in [place] today with Pt and RN  Pt is fully vaccinated for Covid x 2; Due to the COVID-19 pandemic we are asking patients to follow certain guidelines.  Pt aware of COVID protocols and [facility] guidelines

## 2020-12-18 NOTE — Progress Notes (Signed)
Office Visit Note  Patient: Frazier Cassandra             Date of Birth: 1961/02/02           MRN: [id number]             PCP: Clinton Quant, MD Referring: Clinton Quant, MD Visit Date: 01/01/2021 Occupation: _0 @  Subjective:  Pain in multiple joints and positive ANA.   History of Present Illness: Cassandra Frazier is a 60 y.o. female seen in consultation per request of her PCP.  According the patient in June 2021 she developed fatigue and cough.  She was initially seen at the urgent care and was given some medications.  Then she was seen by ENT physician who tried Augmentin and did a CT scan of her sinuses which was negative.  She was seen by an ophthalmologist for dry eyes.  She states her PCP did eventually an antibody test for COVID-19 which was significantly high titer.  As her cough persists she is scheduled an appointment with Dr. Vaughan Browner who did extensive work-up which was negative.  He also gave her reflux medicines which were not helpful.  She was seen by Dr. Arelia Longest and was given pantoprazole 40 mg p.o. daily for 4 weeks without much help.  She had endoscopy last Tuesday which showed erythematous eroded granular hemorrhagic appearing and ulcerated mucosa in the antrum.  Biopsies were done and the results are pending.  On taking further history patient mentioned that she has been taking Motrin for joint pain.  Advised her to stop taking Motrin.  She states she continues to have fatigue and chronic cough.  She has been experiencing joint pain for the last 5 years which she describes in her shoulders, bilateral hands, bilateral knees and her feet.  She states she is to take about 8 tablets of Motrin a day for pain relief.  She also had problems with lower back pain in the past for which she was seen by an orthopedic surgeon in [place] and had some cortisone injections which were helpful.  She still continues to have some lower back pain.  There is positive family history of  osteoarthritis in her mother and her sister.  Gravida 2, para 1, miscarriage 1.  There is no history of DVTs.  Activities of Daily Living:  Patient reports morning stiffness for 30-60 minutes.   Patient Denies nocturnal pain.  Difficulty dressing/grooming: Denies Difficulty climbing stairs: Denies Difficulty getting out of chair: Reports Difficulty using hands for taps, buttons, cutlery, and/or writing: Denies  Review of Systems  Constitutional:  Positive for fatigue.  HENT:  Negative for mouth sores, mouth dryness and nose dryness.   Eyes:  Positive for dryness. Negative for pain and itching.  Respiratory:  Negative for shortness of breath and difficulty breathing.   Cardiovascular:  Negative for chest pain and palpitations.  Gastrointestinal:  Negative for blood in stool, constipation and diarrhea.  Endocrine: Negative for increased urination.  Genitourinary:  Negative for difficulty urinating.  Musculoskeletal:  Positive for joint pain, joint pain, joint swelling and morning stiffness. Negative for myalgias, muscle tenderness and myalgias.  Skin:  Negative for color change, rash, redness and sensitivity to sunlight.  Allergic/Immunologic: Negative for susceptible to infections.  Neurological:  Positive for numbness and headaches. Negative for dizziness, memory loss and weakness.  Hematological:  Positive for bruising/bleeding tendency. Negative for swollen glands.  Psychiatric/Behavioral:  Positive for sleep disturbance. Negative for depressed mood and confusion. The  patient is not nervous/anxious.    PMFS History:  Patient Active Problem List   Diagnosis Date Noted   Hx of adenomatous polyp of rectum 06/28/2014    Past Medical History:  Diagnosis Date   Acute right lumbar radiculopathy    Arthritis    Degeneration of lumbar intervertebral disc    Herniated lumbar intervertebral disc    Hx of adenomatous polyp of rectum 06/28/2014   Hyperlipidemia    diet controlled    Kidney stones     Family History  Problem Relation Age of Onset   Thyroid disease Mother    Pancreatitis Mother        secondary to EGD perforation   Osteoarthritis Mother    ALS Father    Osteoarthritis Sister    Osteoarthritis Brother    Osteoarthritis Brother    Healthy Son    Diabetes Neg Hx    Colon cancer Neg Hx    Colon polyps Neg Hx    Kidney disease Neg Hx    Esophageal cancer Neg Hx    Stomach cancer Neg Hx    Rectal cancer Neg Hx    Past Surgical History:  Procedure Laterality Date   BREAST ENHANCEMENT SURGERY  2009   COLONOSCOPY  2016   CG-MAC-movi(good)-rectal polyp   epidural steroid injection     multiple   ESOPHAGOGASTRODUODENOSCOPY  12/25/2020   TONSILLECTOMY     VULVA SURGERY  2011   WISDOM TOOTH EXTRACTION     Social History   Social History Narrative   Married, employed as a Music therapist, 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  Constitutional:      Appearance: She is well-developed.  HENT:     Head: Normocephalic and atraumatic.  Eyes:     Conjunctiva/sclera: Conjunctivae normal.  Cardiovascular:     Rate and Rhythm: Normal rate and regular rhythm.     Heart sounds: Normal heart sounds.  Pulmonary:     Effort: Pulmonary effort is normal.     Breath sounds: Normal breath sounds.  Abdominal:     General: Bowel sounds are normal.     Palpations: Abdomen is soft.  Musculoskeletal:     Cervical back: Normal range of motion.  Lymphadenopathy:     Cervical: No cervical adenopathy.  Skin:    General: Skin is warm  and dry.     Capillary Refill: Capillary refill takes less than 2 seconds.  Neurological:     Mental Status: She is alert and oriented to person, place, and time.  Psychiatric:        Behavior: Behavior normal.     Musculoskeletal Exam: C-spine was in good range of motion.  She had thoracolumbar scoliosis.  Shoulder joints were in good range of motion with some discomfort.  Elbow joints and wrist joints with good range of motion with no synovitis.  She had bilateral CMC arthritis, PIP and DIP thickening with no synovitis.  Subluxation of right index finger DIP was noted.  Hip  joints with good range of motion.  Knee joints with good range of motion without any warmth swelling or effusion.  There was no tenderness over ankles.  She had bilateral PIP and DIP thickening.  CDAI Exam: CDAI Score: -- Patient Global: --; Provider Global: -- Swollen: --; Tender: -- Joint Exam 01/01/2021   No joint exam has been documented for this visit   There is currently no information documented on the homunculus. Go to the Rheumatology activity and complete the homunculus joint exam.  Investigation: No additional findings.  Imaging: No results found.  Recent Labs: Lab Results  Component Value Date   WBC 6.5 02/07/2020   HGB 14.0 02/07/2020   PLT 332.0 02/07/2020   NA 139 03/16/2020   K 4.2 03/16/2020   CL 103 03/16/2020   CO2 30 03/16/2020   GLUCOSE 93 03/16/2020   BUN 16 03/16/2020   CREATININE 0.65 03/16/2020   BILITOT 1.2 03/16/2020   BILITOT 1.2 03/16/2020   BILITOT 1.2 03/16/2020   ALKPHOS 63 03/16/2020   ALKPHOS 63 03/16/2020   AST 19 03/16/2020   AST 19 03/16/2020   ALT 22 03/16/2020   ALT 22 03/16/2020   PROT 6.6 03/16/2020   PROT 6.6 03/16/2020   ALBUMIN 4.6 03/16/2020   ALBUMIN 4.6 03/16/2020   CALCIUM 9.6 03/16/2020    Speciality Comments: No specialty comments available.  Procedures:  No procedures performed Allergies: Patient has no known allergies.   Assessment /  Plan:     Visit Diagnoses: Positive ANA (antinuclear antibody) - 11/21/20: ANA 1:160 homogeneous, nuclear dots seen, RF<10, ESR 7, CRP<0.3, vitamin D 45, TSH 1.590, T4 4.7, folate WNL, vitamin B12 534,  -she has low titer positive ANA.  She also gives history of sicca symptoms.  There is no history of malar rash, photosensitivity, lymphadenopathy, inflammatory arthritis or Raynaud's phenomenon.  I will obtain AVISE labs.  Plan: Sedimentation rate, Urinalysis, Routine w reflex microscopic  Pain in both hands -she complains of pain and discomfort in her bilateral hands.  Bilateral PIP DIP and CMC thickening consistent with osteoarthritis was noted.  No synovitis was noted.  Joint protection muscle strengthening was discussed.  Handout on hand exercises was given.  Plan: XR Hand 2 View Right, XR Hand 2 View Left.  X-rays of bilateral hands were consistent with osteoarthritis.  Left third MCP mild narrowing was noted which could be consistent with inflammatory arthritis or crystal induced arthropathy.  Chronic pain of both knees -she complains of discomfort in her bilateral knee joints.  No warmth swelling or effusion was noted.  Plan: XR KNEE 3 VIEW RIGHT, XR KNEE 3 VIEW LEFT.  Bilateral knee joint showed moderate osteoarthritis.  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  Bilateral knee joint showed chondrocalcinosis.  Pain in both feet -she had bilateral PIP and DIP thickening with no synovitis.  Plan: XR Foot 2 Views Right, XR Foot 2 Views Left, x-rays were consistent with osteoarthritis.  Uric acid  Chronic midline low back pain without sciatica -she has chronic lower back pain.  She has seen back specialist in the past and had injections.  She denies any radiculopathy currently.  Plan: XR Lumbar Spine 2-3 Views.  Severe scoliosis, multilevel spondylosis and facet joint arthropathy was noted.  Other idiopathic scoliosis, thoracolumbar region-she has  severe thoracolumbar scoliosis.  Other fatigue - Plan: CBC with Differential/Platelet, COMPLETE METABOLIC PANEL WITH GFR, CK, TSH, Serum protein electrophoresis with reflex  Chronic insomnia  Acute  gastric ulcer without hemorrhage or perforation  Hx of adenomatous polyp of rectum  History of hyperlipidemia  Hx of migraines  COVID-19 virus infection - 09/2019  Vitamin D deficiency -history of vitamin D deficiency.  Plan: VITAMIN D 25 Hydroxy (Vit-D Deficiency, Fractures)  Postmenopausal  Osteoporosis screening  Stress  Orders: Orders Placed This Encounter  Procedures   XR KNEE 3 VIEW RIGHT   XR KNEE 3 VIEW LEFT   XR Hand 2 View Right   XR Hand 2 View Left   XR Foot 2 Views Right   XR Foot 2 Views Left   XR Lumbar Spine 2-3 Views   CBC with Differential/Platelet   COMPLETE METABOLIC PANEL WITH GFR   Sedimentation rate   Urinalysis, Routine w reflex microscopic   CK   TSH   Uric acid   Serum protein electrophoresis with reflex   VITAMIN D 25 Hydroxy (Vit-D Deficiency, Fractures)    No orders of the defined types were placed in this encounter.    Follow-Up Instructions: Return for OA.   Bo Merino, MD  Note - This record has been created using Editor, commissioning.  Chart creation errors have been sought, but may not always  have been located. Such creation errors do not reflect on  the standard of medical care.

## 2020-12-21 DIAGNOSIS — H0288A Meibomian gland dysfunction right eye, upper and lower eyelids: Secondary | ICD-10-CM | POA: Diagnosis not present

## 2020-12-25 ENCOUNTER — Ambulatory Visit (AMBULATORY_SURGERY_CENTER): Payer: BC Managed Care – PPO | Admitting: Internal Medicine

## 2020-12-25 ENCOUNTER — Encounter: Payer: Self-pay | Admitting: Internal Medicine

## 2020-12-25 ENCOUNTER — Other Ambulatory Visit: Payer: Self-pay

## 2020-12-25 VITALS — BP 119/74 | HR 63 | Temp 98.9°F | Resp 11 | Ht 63.0 in | Wt 119.0 lb

## 2020-12-25 DIAGNOSIS — R053 Chronic cough: Secondary | ICD-10-CM

## 2020-12-25 DIAGNOSIS — K219 Gastro-esophageal reflux disease without esophagitis: Secondary | ICD-10-CM

## 2020-12-25 DIAGNOSIS — K2970 Gastritis, unspecified, without bleeding: Secondary | ICD-10-CM

## 2020-12-25 DIAGNOSIS — R059 Cough, unspecified: Secondary | ICD-10-CM

## 2020-12-25 DIAGNOSIS — K259 Gastric ulcer, unspecified as acute or chronic, without hemorrhage or perforation: Secondary | ICD-10-CM

## 2020-12-25 DIAGNOSIS — K2900 Acute gastritis without bleeding: Secondary | ICD-10-CM | POA: Diagnosis not present

## 2020-12-25 HISTORY — PX: ESOPHAGOGASTRODUODENOSCOPY: SHX1529

## 2020-12-25 MED ORDER — SODIUM CHLORIDE 0.9 % IV SOLN: 500.0000 mL | INTRAVENOUS | Status: DC

## 2020-12-25 MED ORDER — OMEPRAZOLE 40 MG PO CPDR: 40.0000 mg | DELAYED_RELEASE_CAPSULE | Freq: Two times a day (BID) | ORAL | 0 refills | Status: DC

## 2020-12-25 NOTE — Progress Notes (Signed)
Report to PACU, RN, vss, BBS= Clear.  

## 2020-12-25 NOTE — Op Note (Addendum)
[facility] Patient Name: Cassandra Frazier Procedure Date: 12/25/2020 1:55 PM MRN: [id number] Endoscopist: Gatha Mayer , MD Age: 60 Referring MD:  Date of Birth: Apr 16, 1961 Gender: Female Account #: [id number] Procedure:                Upper GI endoscopy Indications:              Chronic cough Medicines:                Propofol per Anesthesia, Monitored Anesthesia Care Procedure:                Pre-Anesthesia Assessment:                           - Prior to the procedure, a History and Physical                            was performed, and patient medications and                            allergies were reviewed. The patient's tolerance of                            previous anesthesia was also reviewed. The risks                            and benefits of the procedure and the sedation                            options and risks were discussed with the patient.                            All questions were answered, and informed consent                            was obtained. Prior Anticoagulants: The patient has                            taken no previous anticoagulant or antiplatelet                            agents. ASA Grade Assessment: II - A patient with                            mild systemic disease. After reviewing the risks                            and benefits, the patient was deemed in                            satisfactory condition to undergo the procedure.                           After obtaining informed consent, the endoscope was  passed under direct vision. Throughout the                            procedure, the patient's blood pressure, pulse, and                            oxygen saturations were monitored continuously. The                            Endoscope was introduced through the mouth, and                            advanced to the second part of duodenum. The upper                            GI endoscopy was  accomplished without difficulty.                            The patient tolerated the procedure well. Scope In: Scope Out: Findings:                 The larynx was normal.                           The examined esophagus was normal.                           Diffuse mucosal changes characterized by erythema,                            erosion, granularity, hemorrhagic appearance and                            ulceration were found in the gastric antrum.                            Biopsies were taken with a cold forceps for                            histology. Verification of patient identification                            for the specimen was done. Estimated blood loss was                            minimal.                           The examined duodenum was normal.                           The cardia and gastric fundus were normal on                            retroflexion. Complications:  No immediate complications. Estimated Blood Loss:     Estimated blood loss was minimal. Impression:               - Normal larynx.                           - Normal esophagus.                           - Erythematous, eroded, granular, hemorrhagic                            appearing and ulcerated mucosa in the antrum.                            Biopsied.                           - Normal examined duodenum. Recommendation:           - Patient has a contact number available for                            emergencies. The signs and symptoms of potential                            delayed complications were discussed with the                            patient. Return to normal activities tomorrow.                            Written discharge instructions were provided to the                            patient.                           - Resume previous diet.                           - Continue present medications.                           - Await pathology results.                            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

## 2020-12-25 NOTE — Progress Notes (Signed)
Called to room to assist during endoscopic procedure.  Patient ID and intended procedure confirmed with present staff. Received instructions for my participation in the procedure from the performing physician.  

## 2020-12-25 NOTE — Progress Notes (Signed)
Vs CW I have reviewed the patient's medical history in detail and updated the computerized patient record.   

## 2020-12-25 NOTE — Patient Instructions (Addendum)
I did not see a cause of the cough. The pharynx, larynx and esophagus all look great.  It seems possible to me that this is a prolonged post-viral (Covid) cough.  I have some ideas on how to help but want to see what the stomach biopsies show first. You have a fairly severe gastritis and several superficial ulcers. Do not thgink related to the cough but need to know what we are dealing with regardingh this and then will make treatment recommendations.  The ibuprofen may be causing the gastritis and ulcers - starting treatment which can allow the use of ibuprofen but try to use acetaminophen instead.  I appreciate the opportunity to care for you. Gatha Mayer, MD, [facility]    Information on gastritis given to you today.  Await pathology results.  Resume previous diet and medications.  YOU HAD AN ENDOSCOPIC PROCEDURE TODAY AT [facility]:   Refer to the procedure report that was given to you for any specific questions about what was found during the examination.  If the procedure report does not answer your questions, please call your gastroenterologist to clarify.  If you requested that your care partner not be given the details of your procedure findings, then the procedure report has been included in a sealed envelope for you to review at your convenience later.  YOU SHOULD EXPECT: Some feelings of bloating in the abdomen. Passage of more gas than usual.  Walking can help get rid of the air that was put into your GI tract during the procedure and reduce the bloating. If you had a lower endoscopy (such as a colonoscopy or flexible sigmoidoscopy) you may notice spotting of blood in your stool or on the toilet paper. If you underwent a bowel prep for your procedure, you may not have a normal bowel movement for a few days.  Please Note:  You might notice some irritation and congestion in your nose or some drainage.  This is from the oxygen used during your procedure.  There is no  need for concern and it should clear up in a day or so.  SYMPTOMS TO REPORT IMMEDIATELY:   Following upper endoscopy (EGD)  Vomiting of blood or coffee ground material  New chest pain or pain under the shoulder blades  Painful or persistently difficult swallowing  New shortness of breath  Fever of 100F or higher  Black, tarry-looking stools  For urgent or emergent issues, a gastroenterologist can be reached at any hour by calling [phone]. Do not use MyChart messaging for urgent concerns.    DIET:  We do recommend a small meal at first, but then you may proceed to your regular diet.  Drink plenty of fluids but you should avoid alcoholic beverages for 24 hours.  ACTIVITY:  You should plan to take it easy for the rest of today and you should NOT DRIVE or use heavy machinery until tomorrow (because of the sedation medicines used during the test).    FOLLOW UP: Our staff will call the number listed on your records 48-72 hours following your procedure to check on you and address any questions or concerns that you may have regarding the information given to you following your procedure. If we do not reach you, we will leave a message.  We will attempt to reach you two times.  During this call, we will ask if you have developed any symptoms of COVID 19. If you develop any symptoms (ie: fever, flu-like symptoms, shortness  of breath, cough etc.) before then, please call [phone].  If you test positive for Covid 19 in the 2 weeks post procedure, please call and report this information to [place].    If any biopsies were taken you will be contacted by phone or by letter within the next 1-3 weeks.  Please call us at [phone] if you have not heard about the biopsies in 3 weeks.    SIGNATURES/CONFIDENTIALITY: You and/or your care partner have signed paperwork which will be entered into your electronic medical record.  These signatures attest to the fact that that the information above on  your After Visit Summary has been reviewed and is understood.  Full responsibility of the confidentiality of this discharge information lies with you and/or your care-partner.

## 2020-12-27 ENCOUNTER — Telehealth: Payer: Self-pay | Admitting: *Deleted

## 2020-12-27 NOTE — Telephone Encounter (Signed)
Have you developed a fever since your procedure? no  2.   Have you had an respiratory symptoms (SOB or cough) since your procedure? no  3.   Have you tested positive for COVID 19 since your procedure no  4.   Have you had any family members/close contacts diagnosed with the COVID 19 since your procedure?  no   If yes to any of these questions please route to Joylene John, RN and Joella Prince, RN  Follow up Call-  Call back number 12/25/2020  Post procedure Call Back phone  # [phone]  Permission to leave phone message Yes  Some recent data might be hidden     Patient questions:  Do you have a fever, pain , or abdominal swelling? No. Pain Score  0 *  Have you tolerated food without any problems? Yes.    Have you been able to return to your normal activities? Yes.    Do you have any questions about your discharge instructions: Diet   No. Medications  No. Follow up visit  No.  Do you have questions or concerns about your Care? No.  Actions: * If pain score is 4 or above: No action needed, pain <4.

## 2021-01-01 ENCOUNTER — Encounter: Payer: Self-pay | Admitting: Rheumatology

## 2021-01-01 ENCOUNTER — Ambulatory Visit: Payer: Self-pay

## 2021-01-01 ENCOUNTER — Other Ambulatory Visit: Payer: Self-pay

## 2021-01-01 ENCOUNTER — Ambulatory Visit: Payer: BC Managed Care – PPO | Admitting: Rheumatology

## 2021-01-01 VITALS — BP 104/69 | HR 65 | Resp 14 | Ht 62.5 in | Wt 124.2 lb

## 2021-01-01 DIAGNOSIS — F5104 Psychophysiologic insomnia: Secondary | ICD-10-CM

## 2021-01-01 DIAGNOSIS — M79671 Pain in right foot: Secondary | ICD-10-CM | POA: Diagnosis not present

## 2021-01-01 DIAGNOSIS — M79642 Pain in left hand: Secondary | ICD-10-CM | POA: Diagnosis not present

## 2021-01-01 DIAGNOSIS — E559 Vitamin D deficiency, unspecified: Secondary | ICD-10-CM

## 2021-01-01 DIAGNOSIS — F439 Reaction to severe stress, unspecified: Secondary | ICD-10-CM

## 2021-01-01 DIAGNOSIS — R5383 Other fatigue: Secondary | ICD-10-CM

## 2021-01-01 DIAGNOSIS — K253 Acute gastric ulcer without hemorrhage or perforation: Secondary | ICD-10-CM

## 2021-01-01 DIAGNOSIS — M4125 Other idiopathic scoliosis, thoracolumbar region: Secondary | ICD-10-CM

## 2021-01-01 DIAGNOSIS — M79641 Pain in right hand: Secondary | ICD-10-CM | POA: Diagnosis not present

## 2021-01-01 DIAGNOSIS — M25561 Pain in right knee: Secondary | ICD-10-CM

## 2021-01-01 DIAGNOSIS — R768 Other specified abnormal immunological findings in serum: Secondary | ICD-10-CM

## 2021-01-01 DIAGNOSIS — M79672 Pain in left foot: Secondary | ICD-10-CM

## 2021-01-01 DIAGNOSIS — Z13820 Encounter for screening for osteoporosis: Secondary | ICD-10-CM

## 2021-01-01 DIAGNOSIS — Z8669 Personal history of other diseases of the nervous system and sense organs: Secondary | ICD-10-CM

## 2021-01-01 DIAGNOSIS — Z780 Asymptomatic menopausal state: Secondary | ICD-10-CM

## 2021-01-01 DIAGNOSIS — M25562 Pain in left knee: Secondary | ICD-10-CM

## 2021-01-01 DIAGNOSIS — U071 COVID-19: Secondary | ICD-10-CM

## 2021-01-01 DIAGNOSIS — M545 Low back pain, unspecified: Secondary | ICD-10-CM | POA: Diagnosis not present

## 2021-01-01 DIAGNOSIS — G8929 Other chronic pain: Secondary | ICD-10-CM

## 2021-01-01 DIAGNOSIS — Z86010 Personal history of colonic polyps: Secondary | ICD-10-CM

## 2021-01-01 DIAGNOSIS — Z8639 Personal history of other endocrine, nutritional and metabolic disease: Secondary | ICD-10-CM

## 2021-01-01 NOTE — Patient Instructions (Signed)
Journal for Nurse Practitioners, 15(4), 263-267. Retrieved March 01, 2018 from [email or web address]">  Knee Exercises Ask your health care provider which exercises are safe for you. Do exercises exactly as told by your health care provider and adjust them as directed. It is normal to feel mild stretching, pulling, tightness, or discomfort as you do these exercises. Stop right away if you feel sudden pain or your pain gets worse. Do not begin these exercises until told by your health care provider. Stretching and range-of-motion exercises These exercises warm up your muscles and joints and improve the movement and flexibility of your knee. These exercises also help to relieve pain andswelling. Knee extension, prone Lie on your abdomen (prone position) on a bed. Place your left / right knee just beyond the edge of the surface so your knee is not on the bed. You can put a towel under your left / right thigh just above your kneecap for comfort. Relax your leg muscles and allow gravity to straighten your knee (extension). You should feel a stretch behind your left / right knee. Hold this position for __________ seconds. Scoot up so your knee is supported between repetitions. Repeat __________ times. Complete this exercise __________ times a day. Knee flexion, active  Lie on your back with both legs straight. If this causes back discomfort, bend your left / right knee so your foot is flat on the floor. Slowly slide your left / right heel back toward your buttocks. Stop when you feel a gentle stretch in the front of your knee or thigh (flexion). Hold this position for __________ seconds. Slowly slide your left / right heel back to the starting position. Repeat __________ times. Complete this exercise __________ times a day. Quadriceps stretch, prone  Lie on your abdomen on a firm surface, such as a bed or padded floor. Bend your left / right knee and hold your ankle. If you cannot reach  your ankle or pant leg, loop a belt around your foot and grab the belt instead. Gently pull your heel toward your buttocks. Your knee should not slide out to the side. You should feel a stretch in the front of your thigh and knee (quadriceps). Hold this position for __________ seconds. Repeat __________ times. Complete this exercise __________ times a day. Hamstring, supine Lie on your back (supine position). Loop a belt or towel over the ball of your left / right foot. The ball of your foot is on the walking surface, right under your toes. Straighten your left / right knee and slowly pull on the belt to raise your leg until you feel a gentle stretch behind your knee (hamstring). Do not let your knee bend while you do this. Keep your other leg flat on the floor. Hold this position for __________ seconds. Repeat __________ times. Complete this exercise __________ times a day. Strengthening exercises These exercises build strength and endurance in your knee. Endurance is theability to use your muscles for a long time, even after they get tired. Quadriceps, isometric This exercise stretches the muscles in front of your thigh (quadriceps) without moving your knee joint (isometric). Lie on your back with your left / right leg extended and your other knee bent. Put a rolled towel or small pillow under your knee if told by your health care provider. Slowly tense the muscles in the front of your left / right thigh. You should see your kneecap slide up toward your hip or see increased dimpling just above the knee. This motion will   push the back of the knee toward the floor. For __________ seconds, hold the muscle as tight as you can without increasing your pain. Relax the muscles slowly and completely. Repeat __________ times. Complete this exercise __________ times a day. Straight leg raises This exercise stretches the muscles in front of your thigh (quadriceps) and the muscles that move your hips (hip  flexors). Lie on your back with your left / right leg extended and your other knee bent. Tense the muscles in the front of your left / right thigh. You should see your kneecap slide up or see increased dimpling just above the knee. Your thigh may even shake a bit. Keep these muscles tight as you raise your leg 4-6 inches (10-15 cm) off the floor. Do not let your knee bend. Hold this position for __________ seconds. Keep these muscles tense as you lower your leg. Relax your muscles slowly and completely after each repetition. Repeat __________ times. Complete this exercise __________ times a day. Hamstring, isometric Lie on your back on a firm surface. Bend your left / right knee about __________ degrees. Dig your left / right heel into the surface as if you are trying to pull it toward your buttocks. Tighten the muscles in the back of your thighs (hamstring) to "dig" as hard as you can without increasing any pain. Hold this position for __________ seconds. Release the tension gradually and allow your muscles to relax completely for __________ seconds after each repetition. Repeat __________ times. Complete this exercise __________ times a day. Hamstring curls If told by your health care provider, do this exercise while wearing ankle weights. Begin with __________ lb weights. Then increase the weight by 1 lb (0.5 kg) increments. Do not wear ankle weights that are more than __________ lb. Lie on your abdomen with your legs straight. Bend your left / right knee as far as you can without feeling pain. Keep your hips flat against the floor. Hold this position for __________ seconds. Slowly lower your leg to the starting position. Repeat __________ times. Complete this exercise __________ times a day. Squats This exercise strengthens the muscles in front of your thigh and knee (quadriceps). Stand in front of a table, with your feet and knees pointing straight ahead. You may rest your hands on the  table for balance but not for support. Slowly bend your knees and lower your hips like you are going to sit in a chair. Keep your weight over your heels, not over your toes. Keep your lower legs upright so they are parallel with the table legs. Do not let your hips go lower than your knees. Do not bend lower than told by your health care provider. If your knee pain increases, do not bend as low. Hold the squat position for __________ seconds. Slowly push with your legs to return to standing. Do not use your hands to pull yourself to standing. Repeat __________ times. Complete this exercise __________ times a day. Wall slides This exercise strengthens the muscles in front of your thigh and knee (quadriceps). Lean your back against a smooth wall or door, and walk your feet out 18-24 inches (46-61 cm) from it. Place your feet hip-width apart. Slowly slide down the wall or door until your knees bend __________ degrees. Keep your knees over your heels, not over your toes. Keep your knees in line with your hips. Hold this position for __________ seconds. Repeat __________ times. Complete this exercise __________ times a day. Straight leg raises This exercise   strengthens the muscles that rotate the leg at the hip and move it away from your body (hip abductors). Lie on your side with your left / right leg in the top position. Lie so your head, shoulder, knee, and hip line up. You may bend your bottom knee to help you keep your balance. Roll your hips slightly forward so your hips are stacked directly over each other and your left / right knee is facing forward. Leading with your heel, lift your top leg 4-6 inches (10-15 cm). You should feel the muscles in your outer hip lifting. Do not let your foot drift forward. Do not let your knee roll toward the ceiling. Hold this position for __________ seconds. Slowly return your leg to the starting position. Let your muscles relax completely after each  repetition. Repeat __________ times. Complete this exercise __________ times a day. Straight leg raises This exercise stretches the muscles that move your hips away from the front of the pelvis (hip extensors). Lie on your abdomen on a firm surface. You can put a pillow under your hips if that is more comfortable. Tense the muscles in your buttocks and lift your left / right leg about 4-6 inches (10-15 cm). Keep your knee straight as you lift your leg. Hold this position for __________ seconds. Slowly lower your leg to the starting position. Let your leg relax completely after each repetition. Repeat __________ times. Complete this exercise __________ times a day. This information is not intended to replace advice given to you by your health care provider. Make sure you discuss any questions you have with your healthcare provider. Document Revised: 03/02/2018 Document Reviewed: 03/02/2018 [employer] Patient Education  2022 [employer] Hand Exercises Hand exercises can be helpful for almost anyone. These exercises can strengthen the hands, improve flexibility and movement, and increase blood flow to the hands. These results can make work and daily tasks easier. Hand exercises can be especially helpful for people who have joint pain from arthritis or have nerve damage from overuse (carpal tunnel syndrome). These exercises can also help people who have injured a hand. Exercises Most of these hand exercises are gentle stretching and motion exercises. It is usually safe to do them often throughout the day. Warming up your hands before exercise may help to reduce stiffness. You can do this with gentle massage orby placing your hands in warm water for 10-15 minutes. It is normal to feel some stretching, pulling, tightness, or mild discomfort as you begin new exercises. This will gradually improve. Stop an exercise right away if you feel sudden, severe pain or your pain gets worse. Ask your healthcare  provider which exercises are best for you. Knuckle bend or "claw" fist Stand or sit with your arm, hand, and all five fingers pointed straight up. Make sure to keep your wrist straight during the exercise. Gently bend your fingers down toward your palm until the tips of your fingers are touching the top of your palm. Keep your big knuckle straight and just bend the small knuckles in your fingers. Hold this position for __________ seconds. Straighten (extend) your fingers back to the starting position. Repeat this exercise 5-10 times with each hand. Full finger fist Stand or sit with your arm, hand, and all five fingers pointed straight up. Make sure to keep your wrist straight during the exercise. Gently bend your fingers into your palm until the tips of your fingers are touching the middle of your palm. Hold this position for __________ seconds.   Extend your fingers back to the starting position, stretching every joint fully. Repeat this exercise 5-10 times with each hand. Straight fist Stand or sit with your arm, hand, and all five fingers pointed straight up. Make sure to keep your wrist straight during the exercise. Gently bend your fingers at the big knuckle, where your fingers meet your hand, and the middle knuckle. Keep the knuckle at the tips of your fingers straight and try to touch the bottom of your palm. Hold this position for __________ seconds. Extend your fingers back to the starting position, stretching every joint fully. Repeat this exercise 5-10 times with each hand. Tabletop Stand or sit with your arm, hand, and all five fingers pointed straight up. Make sure to keep your wrist straight during the exercise. Gently bend your fingers at the big knuckle, where your fingers meet your hand, as far down as you can while keeping the small knuckles in your fingers straight. Think of forming a tabletop with your fingers. Hold this position for __________ seconds. Extend your fingers  back to the starting position, stretching every joint fully. Repeat this exercise 5-10 times with each hand. Finger spread Place your hand flat on a table with your palm facing down. Make sure your wrist stays straight as you do this exercise. Spread your fingers and thumb apart from each other as far as you can until you feel a gentle stretch. Hold this position for __________ seconds. Bring your fingers and thumb tight together again. Hold this position for __________ seconds. Repeat this exercise 5-10 times with each hand. Making circles Stand or sit with your arm, hand, and all five fingers pointed straight up. Make sure to keep your wrist straight during the exercise. Make a circle by touching the tip of your thumb to the tip of your index finger. Hold for __________ seconds. Then open your hand wide. Repeat this motion with your thumb and each finger on your hand. Repeat this exercise 5-10 times with each hand. Thumb motion Sit with your forearm resting on a table and your wrist straight. Your thumb should be facing up toward the ceiling. Keep your fingers relaxed as you move your thumb. Lift your thumb up as high as you can toward the ceiling. Hold for __________ seconds. Bend your thumb across your palm as far as you can, reaching the tip of your thumb for the small finger (pinkie) side of your palm. Hold for __________ seconds. Repeat this exercise 5-10 times with each hand. Grip strengthening  Hold a stress ball or other soft ball in the middle of your hand. Slowly increase the pressure, squeezing the ball as much as you can without causing pain. Think of bringing the tips of your fingers into the middle of your palm. All of your finger joints should bend when doing this exercise. Hold your squeeze for __________ seconds, then relax. Repeat this exercise 5-10 times with each hand. Contact a health care provider if: Your hand pain or discomfort gets much worse when you do an  exercise. Your hand pain or discomfort does not improve within 2 hours after you exercise. If you have any of these problems, stop doing these exercises right away. Do not do them again unless your health care provider says that you can. Get help right away if: You develop sudden, severe hand pain or swelling. If this happens, stop doing these exercises right away. Do not do them again unless your health care provider says that you can. This   information is not intended to replace advice given to you by your health care provider. Make sure you discuss any questions you have with your healthcare provider. Document Revised: 09/02/2018 Document Reviewed: 05/13/2018 [employer] Patient Education  [place]. Back Exercises The following exercises strengthen the muscles that help to support the trunk and back. They also help to keep the lower back flexible. Doing these exercises can help to prevent back pain or lessen existing pain. If you have back pain or discomfort, try doing these exercises 2-3 times each day or as told by your health care provider. As your pain improves, do them once each day, but increase the number of times that you repeat the steps for each exercise (do more repetitions). To prevent the recurrence of back pain, continue to do these exercises once each day or as told by your health care provider. Do exercises exactly as told by your health care provider and adjust them as directed. It is normal to feel mild stretching, pulling, tightness, or discomfort as you do these exercises, but you should stop right away if youfeel sudden pain or your pain gets worse. Exercises Single knee to chest Repeat these steps 3-5 times for each leg: Lie on your back on a firm bed or the floor with your legs extended. Bring one knee to your chest. Your other leg should stay extended and in contact with the floor. Hold your knee in place by grabbing your knee or thigh with both hands and  hold. Pull on your knee until you feel a gentle stretch in your lower back or buttocks. Hold the stretch for 10-30 seconds. Slowly release and straighten your leg. Pelvic tilt Repeat these steps 5-10 times: Lie on your back on a firm bed or the floor with your legs extended. Bend your knees so they are pointing toward the ceiling and your feet are flat on the floor. Tighten your lower abdominal muscles to press your lower back against the floor. This motion will tilt your pelvis so your tailbone points up toward the ceiling instead of pointing to your feet or the floor. With gentle tension and even breathing, hold this position for 5-10 seconds. Cat-cow Repeat these steps until your lower back becomes more flexible: Get into a hands-and-knees position on a firm surface. Keep your hands under your shoulders, and keep your knees under your hips. You may place padding under your knees for comfort. Let your head hang down toward your chest. Contract your abdominal muscles and point your tailbone toward the floor so your lower back becomes rounded like the back of a cat. Hold this position for 5 seconds. Slowly lift your head, let your abdominal muscles relax and point your tailbone up toward the ceiling so your back forms a sagging arch like the back of a cow. Hold this position for 5 seconds.  Press-ups Repeat these steps 5-10 times: Lie on your abdomen (face-down) on the floor. Place your palms near your head, about shoulder-width apart. Keeping your back as relaxed as possible and keeping your hips on the floor, slowly straighten your arms to raise the top half of your body and lift your shoulders. Do not use your back muscles to raise your upper torso. You may adjust the placement of your hands to make yourself more comfortable. Hold this position for 5 seconds while you keep your back relaxed. Slowly return to lying flat on the floor.  Bridges Repeat these steps 10 times: Lie on your  back  on a firm surface. Bend your knees so they are pointing toward the ceiling and your feet are flat on the floor. Your arms should be flat at your sides, next to your body. Tighten your buttocks muscles and lift your buttocks off the floor until your waist is at almost the same height as your knees. You should feel the muscles working in your buttocks and the back of your thighs. If you do not feel these muscles, slide your feet 1-2 inches farther away from your buttocks. Hold this position for 3-5 seconds. Slowly lower your hips to the starting position, and allow your buttocks muscles to relax completely. If this exercise is too easy, try doing it with your arms crossed over yourchest. Abdominal crunches Repeat these steps 5-10 times: Lie on your back on a firm bed or the floor with your legs extended. Bend your knees so they are pointing toward the ceiling and your feet are flat on the floor. Cross your arms over your chest. Tip your chin slightly toward your chest without bending your neck. Tighten your abdominal muscles and slowly raise your trunk (torso) high enough to lift your shoulder blades a tiny bit off the floor. Avoid raising your torso higher than that because it can put too much stress on your low back and does not help to strengthen your abdominal muscles. Slowly return to your starting position. Back lifts Repeat these steps 5-10 times: Lie on your abdomen (face-down) with your arms at your sides, and rest your forehead on the floor. Tighten the muscles in your legs and your buttocks. Slowly lift your chest off the floor while you keep your hips pressed to the floor. Keep the back of your head in line with the curve in your back. Your eyes should be looking at the floor. Hold this position for 3-5 seconds. Slowly return to your starting position. Contact a health care provider if: Your back pain or discomfort gets much worse when you do an exercise. Your worsening back  pain or discomfort does not lessen within 2 hours after you exercise. If you have any of these problems, stop doing these exercises right away. Do not do them again unless your health care provider says that you can. Get help right away if: You develop sudden, severe back pain. If this happens, stop doing the exercises right away. Do not do them again unless your health care provider says that you can. This information is not intended to replace advice given to you by your health care provider. Make sure you discuss any questions you have with your healthcare provider. Document Revised: 09/16/2018 Document Reviewed: 02/11/2018 Elsevier Patient Education  [employer].

## 2021-01-03 NOTE — Progress Notes (Signed)
White cells and a few bacteria noted in the urine.  If patient has symptoms she should schedule an appointment with her PCP for evaluation of UTI.

## 2021-01-07 LAB — URINALYSIS, ROUTINE W REFLEX MICROSCOPIC
Bilirubin Urine: NEGATIVE
Glucose, UA: NEGATIVE
Hgb urine dipstick: NEGATIVE
Hyaline Cast: NONE SEEN /LPF
Ketones, ur: NEGATIVE
Nitrite: NEGATIVE
Protein, ur: NEGATIVE
RBC / HPF: NONE SEEN /HPF (ref 0–2)
Specific Gravity, Urine: 1.006 (ref 1.001–1.035)
Squamous Epithelial / HPF: NONE SEEN /HPF (ref <=5)
pH: 7 (ref 5.0–8.0)

## 2021-01-07 LAB — COMPLETE METABOLIC PANEL WITH GFR
AG Ratio: 2.2 (calc) (ref 1.0–2.5)
ALT: 30 U/L — ABNORMAL HIGH (ref 6–29)
AST: 26 U/L (ref 10–35)
Albumin: 4.9 g/dL (ref 3.6–5.1)
Alkaline phosphatase (APISO): 77 U/L (ref 37–153)
BUN: 14 mg/dL (ref 7–25)
CO2: 29 mmol/L (ref 20–32)
Calcium: 9.8 mg/dL (ref 8.6–10.4)
Chloride: 103 mmol/L (ref 98–110)
Creat: 0.6 mg/dL (ref 0.50–1.05)
Globulin: 2.2 g/dL (calc) (ref 1.9–3.7)
Glucose, Bld: 95 mg/dL (ref 65–99)
Potassium: 4.6 mmol/L (ref 3.5–5.3)
Sodium: 140 mmol/L (ref 135–146)
Total Bilirubin: 0.9 mg/dL (ref 0.2–1.2)
Total Protein: 7.1 g/dL (ref 6.1–8.1)
eGFR: 103 mL/min/{1.73_m2} (ref >=60)

## 2021-01-07 LAB — PROTEIN ELECTROPHORESIS, SERUM, WITH REFLEX
Albumin ELP: 4.8 g/dL (ref 3.8–4.8)
Alpha 1: 0.3 g/dL (ref 0.2–0.3)
Alpha 2: 0.7 g/dL (ref 0.5–0.9)
Beta 2: 0.3 g/dL (ref 0.2–0.5)
Beta Globulin: 0.4 g/dL (ref 0.4–0.6)
Gamma Globulin: 0.6 g/dL — ABNORMAL LOW (ref 0.8–1.7)
Total Protein: 7.1 g/dL (ref 6.1–8.1)

## 2021-01-07 LAB — CBC WITH DIFFERENTIAL/PLATELET
Absolute Monocytes: 439 cells/uL (ref 200–950)
Basophils Absolute: 40 cells/uL (ref 0–200)
Basophils Relative: 0.7 %
Eosinophils Absolute: 80 cells/uL (ref 15–500)
Eosinophils Relative: 1.4 %
HCT: 45.8 % — ABNORMAL HIGH (ref 35.0–45.0)
Hemoglobin: 14.5 g/dL (ref 11.7–15.5)
Lymphs Abs: 1533 cells/uL (ref 850–3900)
MCH: 27.8 pg (ref 27.0–33.0)
MCHC: 31.7 g/dL — ABNORMAL LOW (ref 32.0–36.0)
MCV: 87.7 fL (ref 80.0–100.0)
MPV: 9.7 fL (ref 7.5–12.5)
Monocytes Relative: 7.7 %
Neutro Abs: 3608 cells/uL (ref 1500–7800)
Neutrophils Relative %: 63.3 %
Platelets: 329 10*3/uL (ref 140–400)
RBC: 5.22 10*6/uL — ABNORMAL HIGH (ref 3.80–5.10)
RDW: 12.1 % (ref 11.0–15.0)
Total Lymphocyte: 26.9 %
WBC: 5.7 10*3/uL (ref 3.8–10.8)

## 2021-01-07 LAB — VITAMIN D 25 HYDROXY (VIT D DEFICIENCY, FRACTURES): Vit D, 25-Hydroxy: 38 ng/mL (ref 30–100)

## 2021-01-07 LAB — MICROSCOPIC MESSAGE

## 2021-01-07 LAB — IFE INTERPRETATION: Immunofix Electr Int: NOT DETECTED

## 2021-01-07 LAB — URIC ACID: Uric Acid, Serum: 4.1 mg/dL (ref 2.5–7.0)

## 2021-01-07 LAB — CK: Total CK: 67 U/L (ref 29–143)

## 2021-01-07 LAB — SEDIMENTATION RATE: Sed Rate: 2 mm/h (ref 0–30)

## 2021-01-07 LAB — TSH: TSH: 2.4 mIU/L (ref 0.40–4.50)

## 2021-01-07 NOTE — Progress Notes (Signed)
I will discuss results at the follow-up visit.

## 2021-01-11 ENCOUNTER — Encounter: Payer: Self-pay | Admitting: Rheumatology

## 2021-01-11 ENCOUNTER — Telehealth: Payer: Self-pay | Admitting: *Deleted

## 2021-01-11 DIAGNOSIS — Z780 Asymptomatic menopausal state: Secondary | ICD-10-CM | POA: Diagnosis not present

## 2021-01-11 DIAGNOSIS — M85851 Other specified disorders of bone density and structure, right thigh: Secondary | ICD-10-CM | POA: Diagnosis not present

## 2021-01-11 DIAGNOSIS — M85852 Other specified disorders of bone density and structure, left thigh: Secondary | ICD-10-CM | POA: Diagnosis not present

## 2021-01-11 NOTE — Telephone Encounter (Signed)
Received DEXA results from [facility].  Date of Scan: 01/11/2021  DX: Osteopenia  Lowest T-score:-1.6  BMD:0.666  Lowest site measured:Left Femoral Neck  Significant changes in BMD and site measured (5% and above):n/a  Current Regimen:n/a  Recommendation: Calcium 1200 mg daily, Vitamin D 2000 units daily, Resistive Exercises, Repeat in 2 years  Reviewed by:Dr. Bo Merino   Next Appointment:  01/30/2021

## 2021-01-18 NOTE — Progress Notes (Signed)
Office Visit Note  Patient: Cassandra Frazier             Date of Birth: November 21, 1960           MRN: [id number]             PCP: Clinton Quant, MD Referring: Clinton Quant, MD Visit Date: 01/30/2021 Occupation: _0 @  Subjective:  Pain in multiple joints.   History of Present Illness: Cassandra Frazier is a 60 y.o. female with history of osteoarthritis.  Patient is accompanied by her sister today.  Patient states that she continues to have pain and discomfort in the bilateral hands, bilateral knees and bilateral feet.  She gives history of intermittent swelling.  He also has intermittent lower back pain.  She states her feet are most painful.  She was given a handout on exercises at the last visit.  She states she did not get a chance to do the exercises.  She has a very busy schedule.  She also wants to discuss her DEXA scan results today.  Activities of Daily Living:  Patient reports morning stiffness for 30 minutes.   Patient Denies nocturnal pain.  Difficulty dressing/grooming: Denies Difficulty climbing stairs: Denies Difficulty getting out of chair: Denies Difficulty using hands for taps, buttons, cutlery, and/or writing: Denies  Review of Systems  Constitutional:  Positive for fatigue. Negative for night sweats, weight gain and weight loss.  HENT:  Negative for mouth sores, trouble swallowing, trouble swallowing, mouth dryness and nose dryness.   Eyes:  Positive for dryness. Negative for pain, redness and visual disturbance.  Respiratory:  Negative for cough, shortness of breath and difficulty breathing.   Cardiovascular:  Negative for chest pain, palpitations, hypertension, irregular heartbeat and swelling in legs/feet.  Gastrointestinal:  Negative for blood in stool, constipation and diarrhea.  Endocrine: Negative for increased urination.  Genitourinary:  Negative for vaginal dryness.  Musculoskeletal:  Positive for joint pain, joint pain and morning stiffness.  Negative for joint swelling, myalgias, muscle weakness, muscle tenderness and myalgias.  Skin:  Negative for color change, rash, hair loss, skin tightness, ulcers and sensitivity to sunlight.  Allergic/Immunologic: Negative for susceptible to infections.  Neurological:  Negative for dizziness, memory loss, night sweats and weakness.  Hematological:  Negative for swollen glands.  Psychiatric/Behavioral:  Positive for sleep disturbance. Negative for depressed mood. The patient is not nervous/anxious.    PMFS History:  Patient Active Problem List   Diagnosis Date Noted   Primary osteoarthritis of both hands 01/30/2021   Chondrocalcinosis 01/30/2021   Primary osteoarthritis of both knees 01/30/2021   Primary osteoarthritis of both feet 01/30/2021   DDD (degenerative disc disease), lumbar 01/30/2021   Other idiopathic scoliosis, thoracolumbar region 01/30/2021   Chronic insomnia 01/30/2021   Other fatigue 01/30/2021   Acute gastric ulcer without hemorrhage or perforation 01/30/2021   History of hyperlipidemia 01/30/2021   Hx of migraines 01/30/2021   Vitamin D deficiency 01/30/2021   Osteopenia of multiple sites 01/30/2021   Hx of adenomatous polyp of rectum 06/28/2014    Past Medical History:  Diagnosis Date   Acute right lumbar radiculopathy    Arthritis    Degeneration of lumbar intervertebral disc    Herniated lumbar intervertebral disc    Hx of adenomatous polyp of rectum 06/28/2014   Hyperlipidemia    diet controlled   Kidney stones     Family History  Problem Relation Age of Onset   Thyroid disease Mother    Pancreatitis  Mother        secondary to EGD perforation   Osteoarthritis Mother    ALS Father    Osteoarthritis Sister    Osteoarthritis Brother    Osteoarthritis Brother    Healthy Son    Diabetes Neg Hx    Colon cancer Neg Hx    Colon polyps Neg Hx    Kidney disease Neg Hx    Esophageal cancer Neg Hx    Stomach cancer Neg Hx    Rectal cancer Neg Hx     Past Surgical History:  Procedure Laterality Date   BREAST ENHANCEMENT SURGERY  2009   COLONOSCOPY  2016   CG-MAC-movi(good)-rectal polyp   epidural steroid injection     multiple   ESOPHAGOGASTRODUODENOSCOPY  12/25/2020   TONSILLECTOMY     VULVA SURGERY  2011   WISDOM TOOTH EXTRACTION     Social History   Social History Narrative   Married, employed as a Music therapist, 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  Constitutional:      Appearance: She is well-developed.  HENT:     Head: Normocephalic and atraumatic.  Eyes:     Conjunctiva/sclera: Conjunctivae normal.  Cardiovascular:     Rate and Rhythm: Normal rate and regular rhythm.     Heart sounds: Normal heart sounds.  Pulmonary:     Effort: Pulmonary effort is normal.     Breath sounds: Normal breath sounds.  Abdominal:     General: Bowel sounds are normal.     Palpations: Abdomen is soft.  Musculoskeletal:     Cervical back: Normal range of motion.  Lymphadenopathy:     Cervical: No cervical adenopathy.  Skin:    General: Skin is warm and dry.     Capillary Refill: Capillary refill takes less than 2 seconds.  Neurological:     Mental Status: She is alert and oriented to person, place, and time.  Psychiatric:        Behavior: Behavior normal.     Musculoskeletal Exam: C-spine was in good range of motion.  She has some discomfort range of motion of her lumbar spine.  Shoulder joints, elbow joints, wrist joints with good range of motion.  She had bilateral PIP and DIP thickening with no synovitis.   Hip joints and knee joints.  Good range of motion without inflammation.  She had bilateral bunion and PIP and DIP thickening.  She has overcrowding of her toes.  CDAI Exam: CDAI Score: -- Patient Global: --; Provider Global: -- Swollen: --; Tender: -- Joint Exam 01/30/2021   No joint exam has been documented for this visit   There is currently no information documented on the homunculus. Go to the Rheumatology activity and complete the homunculus joint exam.  Investigation: No additional findings.  Imaging: XR Foot 2 Views Left  Result Date: 01/01/2021 First MTP, PIP and DIP narrowing was noted.  No intertarsal, tibiotalar or subtalar joint space narrowing was noted.  Inferior calcaneal spur was noted. Impression: These findings are  consistent with osteoarthritis of the foot.  XR Foot 2 Views Right  Result Date: 01/01/2021 First MTP, PIP and DIP narrowing was noted.  No intertarsal, tibiotalar or subtalar joint space narrowing was noted.  Inferior calcaneal spur was noted. Impression: These findings are consistent with osteoarthritis of the foot.  XR Hand 2 View Left  Result Date: 01/01/2021 Severe CMC narrowing and subluxation was noted.  PIP and DIP narrowing was noted.  Mild first MCP narrowing, no other MCP, intercarpal or radiocarpal joint space narrowing was noted. Impression: These findings are consistent with osteoarthritis of the hand.  XR Hand 2 View Right  Result Date: 01/01/2021 CMC, PIP and DIP narrowing was noted.  Subluxation of first DIP joint was noted.  Narrowing of the scaphoid trapezoid joint was noted.  No other intercarpal or radiocarpal joint space narrowing was noted.  No erosive changes were noted. Impression: These findings are consistent with osteoarthritis of the hand.  XR KNEE 3 VIEW LEFT  Result Date: 01/01/2021 Moderate medial compartment narrowing with medial osteophytes were noted.  Chondrocalcinosis was noted.  Moderate patellofemoral narrowing was  noted. Impression: These findings are consistent with moderate osteoarthritis, moderate chondromalacia patella and chondrocalcinosis.  XR KNEE 3 VIEW RIGHT  Result Date: 01/01/2021 Moderate medial compartment narrowing with medial osteophytes were noted.  Chondrocalcinosis was noted.  Severe patellofemoral narrowing was noted. Impression: These findings are consistent with moderate osteoarthritis, severe chondromalacia patella and chondrocalcinosis.  XR Lumbar Spine 2-3 Views  Result Date: 01/01/2021 Dextroscoliosis with multilevel spondylosis was noted.  Severe narrowing between L1-L2, L2-L3, L3-L4, L4-L5 was noted.  Facet joint arthropathy was noted. Impression: These findings are consistent with severe multilevel spondylosis and facet joint arthropathy.   Recent Labs: Lab Results  Component Value Date   WBC 5.7 01/01/2021   HGB 14.5 01/01/2021   PLT 329 01/01/2021   NA 140 01/01/2021   K 4.6 01/01/2021   CL 103 01/01/2021   CO2 29 01/01/2021   GLUCOSE 95 01/01/2021   BUN 14 01/01/2021   CREATININE 0.60 01/01/2021   BILITOT 0.9 01/01/2021   ALKPHOS 63 03/16/2020   ALKPHOS 63 03/16/2020   AST 26 01/01/2021   ALT 30 (H) 01/01/2021   PROT 7.1 01/01/2021   PROT 7.1 01/01/2021   ALBUMIN 4.6 03/16/2020   ALBUMIN 4.6 03/16/2020   CALCIUM 9.8 01/01/2021   January 01, 2021 UA showed 3+ leukocytes and few bacteria, ESR 2, CK 67, TSH normal, uric acid 4.1, vitamin D 38, IFE negative  11/21/20: ANA 1:160 homogeneous, nuclear dots seen, RF<10, ESR 7, CRP<0.3, vitamin D 45, TSH 1.590, T4 4.7, folate WNL, vitamin B12 534  August 01/2021 AVISE lupus index -2.1, ANA negative, ENA negative, CB CAP negative, Jo 1 negative, anticardiolipin negative, beta-2 GP 1 negative, antiphosphatidylserine negative, RF negative, anti-CCP negative, antihistone negative, anti-CarP negative, anti-TPO negative, antithyroglobulin equivocal  Speciality Comments: No specialty comments available.  Procedures:  No  procedures performed Allergies: Patient has no known allergies.   Assessment / Plan:     Visit Diagnoses: Positive ANA (antinuclear antibody) - AVISE lupus index -2.1, ANA negative, antithyroglobulin equivocal.  Primary osteoarthritis of both hands - Clinical and radiographic findings are consistent with osteoarthritis.  Right third MCP joint narrowing was noted which could be due to pseudogout.  X-ray findings were discussed with the patient.  Patient states she did really well on anti-inflammatories in the past but she cannot take them due to gastric ulcer.  She has taken Tylenol without much relief.  I am uncertain that the pain is coming from osteoarthritis or chondrocalcinosis causing pseudogout.  We discussed possible trial of colchicine.  Indications, side effects and contraindications were discussed at length.  A prescription for colchicine 0.6 mg p.o. daily total 30 tablets with 2 refills were given.  I advised her to try half a tablet first if she does not have any GI side effects she may increase it to 1 tablet daily.  If she has an adequate response or no response to colchicine we may discontinue it.  I will also refer her to physical therapy.  Chondrocalcinosis-was noted in her bilateral knee joints.  Primary osteoarthritis of both knees - Moderate osteoarthritis and moderate chondromalacia patella was noted.  Chondrocalcinosis was noted in bilateral knees.  History of chronic knee pain.  She was given a handout on lower extremity exercises which she has not started.  I will refer her to physical therapy.  We will see response to colchicine.  Primary osteoarthritis of both feet-she complains of a lot of discomfort in her feet.  She wears high heels.  I advised her to wear shoes with good arch support and with lower minimal heel.  Exercises for her feet were also discussed.  DDD (degenerative disc disease), lumbar - Severe multilevel spondylosis and facet joint arthropathy was noted.   Dextroscoliosis was noted.  X-ray findings were discussed.  She was referred for physical therapy.  Other idiopathic scoliosis, thoracolumbar region  Chronic insomnia  Other fatigue  Acute gastric ulcer without hemorrhage or perforation-due to NSAID use.  Hx of adenomatous polyp of rectum  History of hyperlipidemia  Hx of migraines  Vitamin D deficiency-she is on vitamin D supplement.  Osteopenia of multiple sites - January 11, 2021 DEXA scan left femoral neck T score -1.6, BMD 0.666.  Discussed calcium, vitamin D and resistive exercises.  Repeat DEXA in 2 years.  COVID-19 virus infection  Orders: Orders Placed This Encounter  Procedures   Ambulatory referral to Physical Therapy    Meds ordered this encounter  Medications   colchicine 0.6 MG tablet    Sig: Take 1 tablet (0.6 mg total) by mouth daily.    Dispense:  30 tablet    Refill:  2      Follow-Up Instructions: Return in about 3 months (around 05/01/2021) for Osteoarthritis.   Bo Merino, MD  Note - This record has been created using Editor, commissioning.  Chart creation errors have been sought, but may not always  have been located. Such creation errors do not reflect on  the standard of medical care.

## 2021-01-30 ENCOUNTER — Other Ambulatory Visit: Payer: Self-pay

## 2021-01-30 ENCOUNTER — Encounter: Payer: Self-pay | Admitting: Rheumatology

## 2021-01-30 ENCOUNTER — Ambulatory Visit: Payer: BC Managed Care – PPO | Admitting: Rheumatology

## 2021-01-30 VITALS — BP 114/78 | HR 71 | Resp 12 | Ht 63.0 in | Wt 125.0 lb

## 2021-01-30 DIAGNOSIS — M19041 Primary osteoarthritis, right hand: Secondary | ICD-10-CM | POA: Diagnosis not present

## 2021-01-30 DIAGNOSIS — R768 Other specified abnormal immunological findings in serum: Secondary | ICD-10-CM | POA: Diagnosis not present

## 2021-01-30 DIAGNOSIS — M19071 Primary osteoarthritis, right ankle and foot: Secondary | ICD-10-CM

## 2021-01-30 DIAGNOSIS — M170 Bilateral primary osteoarthritis of knee: Secondary | ICD-10-CM | POA: Diagnosis not present

## 2021-01-30 DIAGNOSIS — M19072 Primary osteoarthritis, left ankle and foot: Secondary | ICD-10-CM

## 2021-01-30 DIAGNOSIS — M4125 Other idiopathic scoliosis, thoracolumbar region: Secondary | ICD-10-CM

## 2021-01-30 DIAGNOSIS — M8589 Other specified disorders of bone density and structure, multiple sites: Secondary | ICD-10-CM

## 2021-01-30 DIAGNOSIS — K253 Acute gastric ulcer without hemorrhage or perforation: Secondary | ICD-10-CM

## 2021-01-30 DIAGNOSIS — Z860101 Personal history of adenomatous and serrated colon polyps: Secondary | ICD-10-CM

## 2021-01-30 DIAGNOSIS — R5383 Other fatigue: Secondary | ICD-10-CM

## 2021-01-30 DIAGNOSIS — M1120 Other chondrocalcinosis, unspecified site: Secondary | ICD-10-CM | POA: Insufficient documentation

## 2021-01-30 DIAGNOSIS — U071 COVID-19: Secondary | ICD-10-CM

## 2021-01-30 DIAGNOSIS — Z8639 Personal history of other endocrine, nutritional and metabolic disease: Secondary | ICD-10-CM

## 2021-01-30 DIAGNOSIS — R7689 Other specified abnormal immunological findings in serum: Secondary | ICD-10-CM

## 2021-01-30 DIAGNOSIS — M5136 Other intervertebral disc degeneration, lumbar region: Secondary | ICD-10-CM | POA: Insufficient documentation

## 2021-01-30 DIAGNOSIS — Z86010 Personal history of colonic polyps: Secondary | ICD-10-CM

## 2021-01-30 DIAGNOSIS — Z8669 Personal history of other diseases of the nervous system and sense organs: Secondary | ICD-10-CM

## 2021-01-30 DIAGNOSIS — E559 Vitamin D deficiency, unspecified: Secondary | ICD-10-CM

## 2021-01-30 DIAGNOSIS — M19042 Primary osteoarthritis, left hand: Secondary | ICD-10-CM

## 2021-01-30 DIAGNOSIS — F5104 Psychophysiologic insomnia: Secondary | ICD-10-CM

## 2021-01-30 DIAGNOSIS — M51369 Other intervertebral disc degeneration, lumbar region without mention of lumbar back pain or lower extremity pain: Secondary | ICD-10-CM

## 2021-01-30 MED ORDER — COLCHICINE 0.6 MG PO TABS: 0.6000 mg | ORAL_TABLET | Freq: Every day | ORAL | 2 refills | Status: DC

## 2021-02-25 DIAGNOSIS — R079 Chest pain, unspecified: Secondary | ICD-10-CM | POA: Diagnosis not present

## 2021-02-27 DIAGNOSIS — R0789 Other chest pain: Secondary | ICD-10-CM | POA: Diagnosis not present

## 2021-03-22 ENCOUNTER — Ambulatory Visit: Payer: BC Managed Care – PPO | Admitting: Internal Medicine

## 2021-03-22 ENCOUNTER — Encounter: Payer: Self-pay | Admitting: Internal Medicine

## 2021-03-22 VITALS — BP 112/68 | HR 67 | Ht 63.0 in | Wt 122.1 lb

## 2021-03-22 DIAGNOSIS — F5104 Psychophysiologic insomnia: Secondary | ICD-10-CM

## 2021-03-22 DIAGNOSIS — R110 Nausea: Secondary | ICD-10-CM

## 2021-03-22 DIAGNOSIS — R1031 Right lower quadrant pain: Secondary | ICD-10-CM

## 2021-03-22 DIAGNOSIS — Z860101 Personal history of adenomatous and serrated colon polyps: Secondary | ICD-10-CM

## 2021-03-22 DIAGNOSIS — K259 Gastric ulcer, unspecified as acute or chronic, without hemorrhage or perforation: Secondary | ICD-10-CM

## 2021-03-22 DIAGNOSIS — Z86010 Personal history of colonic polyps: Secondary | ICD-10-CM

## 2021-03-22 DIAGNOSIS — R053 Chronic cough: Secondary | ICD-10-CM | POA: Diagnosis not present

## 2021-03-22 DIAGNOSIS — R1032 Left lower quadrant pain: Secondary | ICD-10-CM

## 2021-03-22 DIAGNOSIS — K5909 Other constipation: Secondary | ICD-10-CM

## 2021-03-22 NOTE — Progress Notes (Signed)
Cassandra Frazier 60 y.o. July 20, 1960 [id number]  Assessment & Plan:   Encounter Diagnoses  Name Primary?   Multiple gastric ulcers Yes   Chronic cough    Nausea without vomiting    Chronic insomnia    Chronic constipation    Bilateral lower abdominal pain    Hx of adenomatous polyp of rectum     As far as her gastric ulcer/gastritis I think that was probably due to regular ibuprofen use.  She has stopped that.  I do not think chronic PPI use is necessary.  It has not helped her cough so we will go down to once a day for a month and then stop on the PPI (omeprazole 40 mg daily).  We reviewed some sleep hygiene issues and insomnia handout was provided.  She may try box breathing at bedtime to try to calm herself and go to sleep.  Many of her problems could be tied to her very busy life and not making time for herself, I think.  We reviewed this as well.  Pelvic floor physical therapy will be tried to help with defecation issues.  We did discuss the possibility of pharmacologic therapy with mirtazapine and she is not interested.  Concerned about the possibility of weight gain.  Follow-up to be arranged pending the above.  CC: Pomposini, Cherly Anderson, MD  Subjective   Chief Complaint: Follow-up after EGD, cough also complaints of abdominal pain and nausea and chronic constipation  HPI The patient is a 60 year old woman with a history of chronic cough and chronic constipation seen over the years, here for follow-up after EGD was arranged due to chronic cough, query relationship to reflux she has been treated with twice daily PPI after I found erosive gastritis/multiple gastric ulcers.  She had been taking ibuprofen daily for arthritis pains.  Subsequently she has seen rheumatology and a negative evaluation as far as autoimmune diseases was found because of the lack of response to acetaminophen colchicine was tried but that has not helped and she stopped it.  She has years of chronic  constipation.  Formulary issues have precluded pharmacologic treatment though that has helped in the past (Linzess).  Amitiza is not on her formulary either.  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  Reports she is stressed about her busy job plus caring for her husband.  Other problems include rather chronic nausea and now having some diffuse abdominal discomfort.  Lower abdominal pain after eating.  Definitely better when moving her bowels.  Very poor sleep for years.  Sometimes uses NyQuil.  Recently had some left-sided chest pain radiating into her arm and saw her primary care provider and EKG and chest x-ray were okay though she is going to see a cardiologist in January.  Wt Readings from Last 3 Encounters:  03/22/21 122 lb 2 oz (55.4 kg)  01/30/21 125 lb (56.7 kg)  01/01/21 124 lb 3.2 oz (56.3 kg)       EGD results from 12/25/2020 below Normal larynx. - Normal esophagus. - Erythematous, eroded, granular, hemorrhagic appearing and ulcerated mucosa in the antrum. Biopsied. - Normal examined duodenum. Impression: - Patient has a contact number available for emergencies. The signs and symptoms of potential delayed complications were discussed with the patient. Return to normal activities tomorrow.  Written discharge instructions were provided to the patient. - Resume previous diet. - Continue present medications. - Await pathology results.-Path showed reactive inflammation no H. pylori     Wt Readings from Last 3 Encounters:  03/22/21 122 lb 2 oz (55.4 kg)  01/30/21 125 lb (56.7 kg)  01/01/21 124 lb 3.2 oz (56.3 kg)     No Known Allergies Current Meds  Medication Sig   cycloSPORINE (RESTASIS) 0.05 % ophthalmic emulsion as needed.   Glycerin, Laxative, (FLEET LIQUID GLYCERIN  SUPP RE) Place 15 suppositories rectally daily.   omeprazole (PRILOSEC) 40 MG capsule Take 1 capsule (40 mg total) by mouth 2 (two) times daily before a meal. Before breakfast and supper   prednisoLONE acetate (PRED FORTE) 1 % ophthalmic suspension 1 drop 4 (four) times daily as needed.   Pseudoeph-Doxylamine-DM-APAP (NYQUIL PO) Take by mouth as needed.   Past Medical History:  Diagnosis Date   Acute right lumbar radiculopathy    ANA positive    Chronic constipation    Degeneration of lumbar intervertebral disc    Herniated lumbar intervertebral disc    Hx of adenomatous polyp of rectum 06/28/2014   Hyperlipidemia    diet controlled   Kidney stones    Osteoarthritis    Past Surgical History:  Procedure Laterality Date   BREAST ENHANCEMENT SURGERY  2009   COLONOSCOPY  2016   CG-MAC-movi(good)-rectal polyp   epidural steroid injection     multiple   ESOPHAGOGASTRODUODENOSCOPY  12/25/2020   TONSILLECTOMY     VULVA SURGERY  2011   WISDOM TOOTH EXTRACTION     Social History   Social History Narrative   Married, employed as a Music therapist, 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   Review of Systems As per HPI  Objective:   Physical Exam BP 112/68   Pulse 67   Ht _0  (1.6 m)   Wt 122 lb 2 oz (55.4 kg)   LMP 04/04/2014   BMI 21.63 kg/m   Well-developed well-nourished white woman in no acute distress abdomen is soft benign feel overall that is somewhat tender diffusely but no rebound no organomegaly or mass no worrisome features   Data reviewed includes prior GI notes the endoscopy report and images pathology, recent rheumatology visits, pulmonary visit from 2021 and labs in the EMR through care everywhere from primary care

## 2021-03-22 NOTE — Patient Instructions (Addendum)
  Box breathing to relax and reduce anxiety  Inhale to a count of 4; hold that 4 count; exhale over 4 count; hold exhale x 4  Then repeat this - a few times   Decrease your omeprazole to 40mg  daily and then stop it.  We are referring you for pelvic floor physical therapy. They will contact you about an appointment.   I appreciate the opportunity to care for you. Silvano Rusk, MD, [facility]

## 2021-03-24 ENCOUNTER — Encounter: Payer: Self-pay | Admitting: Internal Medicine

## 2021-04-19 NOTE — Progress Notes (Deleted)
Office Visit Note  Patient: Cassandra Frazier             Date of Birth: Mar 07, 1961           MRN: [id number]             PCP: Clinton Quant, MD Referring: Clinton Quant, MD Visit Date: 05/01/2021 Occupation: _0 @  Subjective:  No chief complaint on file.   History of Present Illness: Cassandra Frazier is a 60 y.o. female ***   Activities of Daily Living:  Patient reports morning stiffness for *** {minute/hour:19697}.   Patient {ACTIONS;DENIES/REPORTS:21021675::"Denies"} nocturnal pain.  Difficulty dressing/grooming: {ACTIONS;DENIES/REPORTS:21021675::"Denies"} Difficulty climbing stairs: {ACTIONS;DENIES/REPORTS:21021675::"Denies"} Difficulty getting out of chair: {ACTIONS;DENIES/REPORTS:21021675::"Denies"} Difficulty using hands for taps, buttons, cutlery, and/or writing: {ACTIONS;DENIES/REPORTS:21021675::"Denies"}  No Rheumatology ROS completed.   PMFS History:  Patient Active Problem List   Diagnosis Date Noted   Primary osteoarthritis of both hands 01/30/2021   Chondrocalcinosis 01/30/2021   Primary osteoarthritis of both knees 01/30/2021   Primary osteoarthritis of both feet 01/30/2021   DDD (degenerative disc disease), lumbar 01/30/2021   Other idiopathic scoliosis, thoracolumbar region 01/30/2021   Chronic insomnia 01/30/2021   Other fatigue 01/30/2021   Acute gastric ulcer without hemorrhage or perforation 01/30/2021   History of hyperlipidemia 01/30/2021   Hx of migraines 01/30/2021   Vitamin D deficiency 01/30/2021   Osteopenia of multiple sites 01/30/2021   Hx of adenomatous polyp of rectum 06/28/2014    Past Medical History:  Diagnosis Date   Acute right lumbar radiculopathy    ANA positive    Chronic constipation    Chronic cough    Chronic insomnia    COVID-19    Positive antibodies in 2021 suspected infection spring 2021   Degeneration of lumbar intervertebral disc    Herniated lumbar intervertebral disc    Hx of adenomatous polyp of  rectum 06/28/2014   Hyperlipidemia    diet controlled   Kidney stones    Osteoarthritis     Family History  Problem Relation Age of Onset   Thyroid disease Mother    Pancreatitis Mother        secondary to EGD perforation   Osteoarthritis Mother    ALS Father    Osteoarthritis Sister    Osteoarthritis Brother    Osteoarthritis Brother    Healthy Son    Diabetes Neg Hx    Colon cancer Neg Hx    Colon polyps Neg Hx    Kidney disease Neg Hx    Esophageal cancer Neg Hx    Stomach cancer Neg Hx    Rectal cancer Neg Hx    Past Surgical History:  Procedure Laterality Date   BREAST ENHANCEMENT SURGERY  2009   COLONOSCOPY  2016   CG-MAC-movi(good)-rectal polyp   epidural steroid injection     multiple   ESOPHAGOGASTRODUODENOSCOPY  12/25/2020   TONSILLECTOMY     VULVA SURGERY  2011   WISDOM TOOTH EXTRACTION     Social History   Social History Narrative   Married, employed as a Music therapist, 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 Go to the Rheumatology activity and complete the homunculus joint exam.  Investigation: No additional findings.  Imaging: No results found.  Recent Labs: Lab Results  Component Value Date   WBC 5.7 01/01/2021   HGB 14.5 01/01/2021   PLT 329 01/01/2021   NA 140 01/01/2021   K 4.6 01/01/2021   CL 103 01/01/2021   CO2 29 01/01/2021   GLUCOSE 95 01/01/2021   BUN 14 01/01/2021   CREATININE 0.60 01/01/2021   BILITOT 0.9 01/01/2021   ALKPHOS 63 03/16/2020    ALKPHOS 63 03/16/2020   AST 26 01/01/2021   ALT 30 (H) 01/01/2021   PROT 7.1 01/01/2021   PROT 7.1 01/01/2021   ALBUMIN 4.6 03/16/2020   ALBUMIN 4.6 03/16/2020   CALCIUM 9.8 01/01/2021    Speciality Comments: No specialty comments available.  Procedures:  No procedures performed Allergies: Patient has no known allergies.   Assessment / Plan:     Visit Diagnoses: Positive ANA (antinuclear antibody)  Primary osteoarthritis of both hands  Chondrocalcinosis  Primary osteoarthritis of both knees  Primary osteoarthritis of both feet  DDD (degenerative disc disease), lumbar  Other idiopathic scoliosis, thoracolumbar region  Chronic insomnia  Other fatigue  Acute gastric ulcer without hemorrhage or perforation  Hx of adenomatous polyp of rectum  History of hyperlipidemia  Hx of migraines  Vitamin D deficiency  Osteopenia of multiple sites  COVID-19 virus infection  Orders: No orders of the defined types were placed in this encounter.  No orders of the defined types were placed in this encounter.   Face-to-face time spent with patient was *** minutes. Greater than 50% of time was spent in counseling and coordination of care.  Follow-Up Instructions: No follow-ups on file.   Ofilia Neas, PA-C  Note - This record has been created using Dragon software.  Chart creation errors have been sought, but may not always  have been located. Such creation errors do not reflect on  the standard of medical care.

## 2021-04-22 DIAGNOSIS — H0012 Chalazion right lower eyelid: Secondary | ICD-10-CM | POA: Diagnosis not present

## 2021-04-26 ENCOUNTER — Encounter: Payer: Self-pay | Admitting: Physical Therapy

## 2021-04-26 ENCOUNTER — Ambulatory Visit: Payer: BC Managed Care – PPO | Attending: Internal Medicine | Admitting: Physical Therapy

## 2021-04-26 ENCOUNTER — Other Ambulatory Visit: Payer: Self-pay

## 2021-04-26 DIAGNOSIS — K5909 Other constipation: Secondary | ICD-10-CM | POA: Diagnosis not present

## 2021-04-26 DIAGNOSIS — R252 Cramp and spasm: Secondary | ICD-10-CM | POA: Insufficient documentation

## 2021-04-26 DIAGNOSIS — R279 Unspecified lack of coordination: Secondary | ICD-10-CM | POA: Diagnosis not present

## 2021-04-26 NOTE — Patient Instructions (Signed)

## 2021-04-26 NOTE — Therapy (Signed)
[facility] @ [address] [place], [place], [address] Phone: [phone]   Fax:  [phone]  Physical Therapy Evaluation  Patient Details  Name: Cassandra Frazier MRN: [id number] Date of Birth: [birth year] Referring Provider (PT): Gatha Mayer, MD  Encounter Date: 04/26/2021   PT End of Session - 04/26/21 1024     Visit Number 1    Date for PT Re-Evaluation 07/19/21    Authorization Type BCBS    PT Start Time 1018    PT Stop Time 1059    PT Time Calculation (min) 41 min    Activity Tolerance Patient tolerated treatment well    Behavior During Therapy [facility] for tasks assessed/performed             Past Medical History:  Diagnosis Date   Acute right lumbar radiculopathy    ANA positive    Chronic constipation    Chronic cough    Chronic insomnia    COVID-19    Positive antibodies in 2021 suspected infection spring 2021   Degeneration of lumbar intervertebral disc    Herniated lumbar intervertebral disc    Hx of adenomatous polyp of rectum 06/28/2014   Hyperlipidemia    diet controlled   Kidney stones    Osteoarthritis     Past Surgical History:  Procedure Laterality Date   BREAST ENHANCEMENT SURGERY  2009   COLONOSCOPY  2016   CG-MAC-movi(good)-rectal polyp   epidural steroid injection     multiple   ESOPHAGOGASTRODUODENOSCOPY  12/25/2020   TONSILLECTOMY     VULVA SURGERY  2011   WISDOM TOOTH EXTRACTION      There were no vitals filed for this visit.    Subjective Assessment - 04/26/21 1022     Subjective Pt states she has used 10-20 sepositories a day to go to the bathroom.  I never go on my own.  Recently I have had a cough that nobody can figure out. I travel a lot for work and taking care of my husband                [facility] PT Assessment - 04/26/21 0001       Assessment   Medical Diagnosis K59.09 (ICD-10-CM) - Chronic constipation    Referring Provider (PT) Gatha Mayer, MD    Prior  Therapy n      Precautions   Precautions None      Balance Screen   Has the patient fallen in the past 6 months No      [place] residence    Living Arrangements Spouse/significant other      Prior Function   Level of Independence Independent    Vocation Full time employment    Vocation Requirements sitting and traveling      Cognition   Overall Cognitive Status Within Functional Limits for tasks assessed      Posture/Postural Control   Posture/Postural Control Postural limitations    Postural Limitations Rounded Shoulders    Posture Comments scoliosis      ROM / Strength   AROM / PROM / Strength Strength;AROM      AROM   Overall AROM Comments lumbar 75%      Strength   Overall Strength Comments hip abd/adduction - 4/5      Flexibility   Soft Tissue Assessment /Muscle Length yes    Hamstrings WFL      Ambulation/Gait   Gait Pattern Within  Functional Limits                        Objective measurements completed on examination: See above findings.     Pelvic Floor Special Questions - 04/26/21 0001     Prior Pelvic/Prostate Exam Yes    Fecal incontinence --   constipation   Scar Well healed;Restricted    Pelvic Floor Internal Exam pt identity confirmed and informed consent to do assessment given    Exam Type Rectal    Palpation unable to relax without strong tactile cues and cues to breathe    Strength weak squeeze, no lift    Strength # of reps 1   not relaxing between quick flicks and tone remains high   Strength # of seconds 1    Tone high              [facility] Adult PT Treatment/Exercise - 04/26/21 0001       Self-Care   Self-Care Other Self-Care Comments    Other Self-Care Comments  toileting and types of fiber                     PT Education - 04/26/21 1146     Education Details toilet techniques, breathing and fiber    Person(s) Educated Patient    Methods  Explanation;Demonstration;Tactile cues;Verbal cues;Handout    Comprehension Verbalized understanding;Returned demonstration              PT Short Term Goals - 04/26/21 1152       PT SHORT TERM GOAL #1   Title ind with toileting techniques    Time 4    Period Weeks    Status New    Target Date 05/24/21               PT Long Term Goals - 04/26/21 1028       PT LONG TERM GOAL #1   Title Pt will be able to have a stool that is least diameter fo a nickel due to improved muscle length    Baseline pencil thin    Time 12    Period Weeks    Status New      PT LONG TERM GOAL #2   Title Pt will report no nausea due to improved BMs    Time 12    Period Weeks    Status New    Target Date 07/19/21      PT LONG TERM GOAL #3   Title Pt will be able to have BM without using a suppository    Time 12    Period Weeks    Status New    Target Date 07/19/21      PT LONG TERM GOAL #4   Title Pt will be ind with HEP for improved flexibility and functional strength    Time 12    Period Weeks    Status New    Target Date 07/19/21                    Plan - 04/26/21 1155     Clinical Impression Statement Pt presents to clinic due to chronic constipation.  Pt has been using multipel suppositories daily to have BMs.  Pt is getting nauseous at times, straining, and abnormal stool consistency.  Pt has scar tissue from 4th degree tear from vaginal delivery.  Pt has high tone pelvic floor and unable to do quick flicks  due to lack of relaxing between muscle contractions. Pt is able to sustain full contraction for 1 sec then releases but tone remains high.  Pt has weakness in bil hip with 4/5 MMT abduction and adduction.  Pt has decreased pressure in abdominal wall and low abdominal pressure created with attempt to expel from rectum.  Pt will benefit from skilled PT to address impairments mentioned above and improve bowel function for reduced abdominal discomfort and nausea.     Examination-Activity Limitations Toileting    Stability/Clinical Decision Making Stable/Uncomplicated    Clinical Decision Making Moderate    Rehab Potential Excellent    PT Frequency 1x / week    PT Duration 12 weeks    PT Treatment/Interventions ADLs/Self Care Home Management;Biofeedback;Cryotherapy;Electrical Stimulation;Moist Heat;Therapeutic exercise;Neuromuscular re-education;Patient/family education;Manual techniques;Taping;Dry needling;Passive range of motion    PT Next Visit Plan abdominal fascial release; breathing and bulging with stretches; f/u on toileting techniques    PT Home Exercise Plan toileting and fiber    Consulted and Agree with Plan of Care Patient             Patient will benefit from skilled therapeutic intervention in order to improve the following deficits and impairments:  Decreased endurance, Decreased coordination, Decreased strength, Postural dysfunction  Visit Diagnosis: Unspecified lack of coordination  Cramp and spasm     Problem List Patient Active Problem List   Diagnosis Date Noted   Primary osteoarthritis of both hands 01/30/2021   Chondrocalcinosis 01/30/2021   Primary osteoarthritis of both knees 01/30/2021   Primary osteoarthritis of both feet 01/30/2021   DDD (degenerative disc disease), lumbar 01/30/2021   Other idiopathic scoliosis, thoracolumbar region 01/30/2021   Chronic insomnia 01/30/2021   Other fatigue 01/30/2021   Acute gastric ulcer without hemorrhage or perforation 01/30/2021   History of hyperlipidemia 01/30/2021   Hx of migraines 01/30/2021   Vitamin D deficiency 01/30/2021   Osteopenia of multiple sites 01/30/2021   Hx of adenomatous polyp of rectum 06/28/2014    Jule Ser, PT 04/26/2021, 12:27 PM  [facility] @ [address] [place], [place], [address] Phone: [phone]   Fax:  [phone]  Name: Cassandra Frazier MRN: [id number] Date of Birth:  03-11-1961

## 2021-05-01 ENCOUNTER — Ambulatory Visit: Payer: BC Managed Care – PPO | Admitting: Rheumatology

## 2021-05-01 DIAGNOSIS — M5136 Other intervertebral disc degeneration, lumbar region: Secondary | ICD-10-CM

## 2021-05-01 DIAGNOSIS — Z86010 Personal history of colonic polyps: Secondary | ICD-10-CM

## 2021-05-01 DIAGNOSIS — R768 Other specified abnormal immunological findings in serum: Secondary | ICD-10-CM

## 2021-05-01 DIAGNOSIS — M170 Bilateral primary osteoarthritis of knee: Secondary | ICD-10-CM

## 2021-05-01 DIAGNOSIS — E559 Vitamin D deficiency, unspecified: Secondary | ICD-10-CM

## 2021-05-01 DIAGNOSIS — M4125 Other idiopathic scoliosis, thoracolumbar region: Secondary | ICD-10-CM

## 2021-05-01 DIAGNOSIS — M1120 Other chondrocalcinosis, unspecified site: Secondary | ICD-10-CM

## 2021-05-01 DIAGNOSIS — R5383 Other fatigue: Secondary | ICD-10-CM

## 2021-05-01 DIAGNOSIS — F5104 Psychophysiologic insomnia: Secondary | ICD-10-CM

## 2021-05-01 DIAGNOSIS — Z8669 Personal history of other diseases of the nervous system and sense organs: Secondary | ICD-10-CM

## 2021-05-01 DIAGNOSIS — M19071 Primary osteoarthritis, right ankle and foot: Secondary | ICD-10-CM

## 2021-05-01 DIAGNOSIS — Z8639 Personal history of other endocrine, nutritional and metabolic disease: Secondary | ICD-10-CM

## 2021-05-01 DIAGNOSIS — M19041 Primary osteoarthritis, right hand: Secondary | ICD-10-CM

## 2021-05-01 DIAGNOSIS — M8589 Other specified disorders of bone density and structure, multiple sites: Secondary | ICD-10-CM

## 2021-05-01 DIAGNOSIS — U071 COVID-19: Secondary | ICD-10-CM

## 2021-05-01 DIAGNOSIS — K253 Acute gastric ulcer without hemorrhage or perforation: Secondary | ICD-10-CM

## 2021-05-02 DIAGNOSIS — Z01419 Encounter for gynecological examination (general) (routine) without abnormal findings: Secondary | ICD-10-CM | POA: Diagnosis not present

## 2021-05-02 DIAGNOSIS — Z1212 Encounter for screening for malignant neoplasm of rectum: Secondary | ICD-10-CM | POA: Diagnosis not present

## 2021-05-24 DIAGNOSIS — Z1231 Encounter for screening mammogram for malignant neoplasm of breast: Secondary | ICD-10-CM | POA: Diagnosis not present

## 2021-06-07 ENCOUNTER — Encounter: Payer: BC Managed Care – PPO | Admitting: Physical Therapy

## 2021-06-09 DIAGNOSIS — J329 Chronic sinusitis, unspecified: Secondary | ICD-10-CM | POA: Diagnosis not present

## 2021-06-09 DIAGNOSIS — J40 Bronchitis, not specified as acute or chronic: Secondary | ICD-10-CM | POA: Diagnosis not present

## 2021-06-09 DIAGNOSIS — R059 Cough, unspecified: Secondary | ICD-10-CM | POA: Diagnosis not present

## 2021-06-14 NOTE — Progress Notes (Deleted)
Office Visit Note  Patient: Cassandra Frazier             Date of Birth: Jun 13, 1960           MRN: [id number]             PCP: Clinton Quant, MD Referring: Clinton Quant, MD Visit Date: 06/26/2021 Occupation: _0 @  Subjective:  No chief complaint on file.   History of Present Illness: Cassandra Frazier is a 62 y.o. female ***   Activities of Daily Living:  Patient reports morning stiffness for *** {minute/hour:19697}.   Patient {ACTIONS;DENIES/REPORTS:21021675::"Denies"} nocturnal pain.  Difficulty dressing/grooming: {ACTIONS;DENIES/REPORTS:21021675::"Denies"} Difficulty climbing stairs: {ACTIONS;DENIES/REPORTS:21021675::"Denies"} Difficulty getting out of chair: {ACTIONS;DENIES/REPORTS:21021675::"Denies"} Difficulty using hands for taps, buttons, cutlery, and/or writing: {ACTIONS;DENIES/REPORTS:21021675::"Denies"}  No Rheumatology ROS completed.   PMFS History:  Patient Active Problem List   Diagnosis Date Noted   Primary osteoarthritis of both hands 01/30/2021   Chondrocalcinosis 01/30/2021   Primary osteoarthritis of both knees 01/30/2021   Primary osteoarthritis of both feet 01/30/2021   DDD (degenerative disc disease), lumbar 01/30/2021   Other idiopathic scoliosis, thoracolumbar region 01/30/2021   Chronic insomnia 01/30/2021   Other fatigue 01/30/2021   Acute gastric ulcer without hemorrhage or perforation 01/30/2021   History of hyperlipidemia 01/30/2021   Hx of migraines 01/30/2021   Vitamin D deficiency 01/30/2021   Osteopenia of multiple sites 01/30/2021   Hx of adenomatous polyp of rectum 06/28/2014    Past Medical History:  Diagnosis Date   Acute right lumbar radiculopathy    ANA positive    Chronic constipation    Chronic cough    Chronic insomnia    COVID-19    Positive antibodies in 2021 suspected infection spring 2021   Degeneration of lumbar intervertebral disc    Herniated lumbar intervertebral disc    Hx of adenomatous polyp of  rectum 06/28/2014   Hyperlipidemia    diet controlled   Kidney stones    Osteoarthritis     Family History  Problem Relation Age of Onset   Thyroid disease Mother    Pancreatitis Mother        secondary to EGD perforation   Osteoarthritis Mother    ALS Father    Osteoarthritis Sister    Osteoarthritis Brother    Osteoarthritis Brother    Healthy Son    Diabetes Neg Hx    Colon cancer Neg Hx    Colon polyps Neg Hx    Kidney disease Neg Hx    Esophageal cancer Neg Hx    Stomach cancer Neg Hx    Rectal cancer Neg Hx    Past Surgical History:  Procedure Laterality Date   BREAST ENHANCEMENT SURGERY  2009   COLONOSCOPY  2016   CG-MAC-movi(good)-rectal polyp   epidural steroid injection     multiple   ESOPHAGOGASTRODUODENOSCOPY  12/25/2020   TONSILLECTOMY     VULVA SURGERY  2011   WISDOM TOOTH EXTRACTION     Social History   Social History Narrative   Married, employed as a Music therapist, 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 Go to the Rheumatology activity and complete the homunculus joint exam.  Investigation: No additional findings.  Imaging: No results found.  Recent Labs: Lab Results  Component Value Date   WBC 5.7 01/01/2021   HGB 14.5 01/01/2021   PLT 329 01/01/2021   NA 140 01/01/2021   K 4.6 01/01/2021   CL 103 01/01/2021   CO2 29 01/01/2021   GLUCOSE 95 01/01/2021   BUN 14 01/01/2021   CREATININE 0.60 01/01/2021   BILITOT 0.9 01/01/2021   ALKPHOS 63 03/16/2020    ALKPHOS 63 03/16/2020   AST 26 01/01/2021   ALT 30 (H) 01/01/2021   PROT 7.1 01/01/2021   PROT 7.1 01/01/2021   ALBUMIN 4.6 03/16/2020   ALBUMIN 4.6 03/16/2020   CALCIUM 9.8 01/01/2021    Speciality Comments: No specialty comments available.  Procedures:  No procedures performed Allergies: Patient has no known allergies.   Assessment / Plan:     Visit Diagnoses: No diagnosis found.  Orders: No orders of the defined types were placed in this encounter.  No orders of the defined types were placed in this encounter.   Face-to-face time spent with patient was *** minutes. Greater than 50% of time was spent in counseling and coordination of care.  Follow-Up Instructions: No follow-ups on file.   Earnestine Mealing, CMA  Note - This record has been created using Editor, commissioning.  Chart creation errors have been sought, but may not always  have been located. Such creation errors do not reflect on  the standard of medical care.

## 2021-06-17 DIAGNOSIS — R079 Chest pain, unspecified: Secondary | ICD-10-CM | POA: Diagnosis not present

## 2021-06-17 DIAGNOSIS — E785 Hyperlipidemia, unspecified: Secondary | ICD-10-CM | POA: Diagnosis not present

## 2021-06-26 ENCOUNTER — Ambulatory Visit: Payer: BC Managed Care – PPO | Admitting: Rheumatology

## 2021-06-26 DIAGNOSIS — K253 Acute gastric ulcer without hemorrhage or perforation: Secondary | ICD-10-CM

## 2021-06-26 DIAGNOSIS — E559 Vitamin D deficiency, unspecified: Secondary | ICD-10-CM

## 2021-06-26 DIAGNOSIS — F5104 Psychophysiologic insomnia: Secondary | ICD-10-CM

## 2021-06-26 DIAGNOSIS — M8589 Other specified disorders of bone density and structure, multiple sites: Secondary | ICD-10-CM

## 2021-06-26 DIAGNOSIS — Z8639 Personal history of other endocrine, nutritional and metabolic disease: Secondary | ICD-10-CM

## 2021-06-26 DIAGNOSIS — M5136 Other intervertebral disc degeneration, lumbar region: Secondary | ICD-10-CM

## 2021-06-26 DIAGNOSIS — M4125 Other idiopathic scoliosis, thoracolumbar region: Secondary | ICD-10-CM

## 2021-06-26 DIAGNOSIS — M19071 Primary osteoarthritis, right ankle and foot: Secondary | ICD-10-CM

## 2021-06-26 DIAGNOSIS — U071 COVID-19: Secondary | ICD-10-CM

## 2021-06-26 DIAGNOSIS — M170 Bilateral primary osteoarthritis of knee: Secondary | ICD-10-CM

## 2021-06-26 DIAGNOSIS — R768 Other specified abnormal immunological findings in serum: Secondary | ICD-10-CM

## 2021-06-26 DIAGNOSIS — M19041 Primary osteoarthritis, right hand: Secondary | ICD-10-CM

## 2021-06-26 DIAGNOSIS — R5383 Other fatigue: Secondary | ICD-10-CM

## 2021-06-26 DIAGNOSIS — Z86010 Personal history of colonic polyps: Secondary | ICD-10-CM

## 2021-06-26 DIAGNOSIS — M1120 Other chondrocalcinosis, unspecified site: Secondary | ICD-10-CM

## 2021-06-26 DIAGNOSIS — Z8669 Personal history of other diseases of the nervous system and sense organs: Secondary | ICD-10-CM

## 2021-08-12 ENCOUNTER — Encounter: Payer: Self-pay | Admitting: Internal Medicine

## 2021-08-15 ENCOUNTER — Ambulatory Visit (INDEPENDENT_AMBULATORY_CARE_PROVIDER_SITE_OTHER): Payer: BC Managed Care – PPO | Admitting: Obstetrics and Gynecology

## 2021-08-15 ENCOUNTER — Other Ambulatory Visit: Payer: Self-pay

## 2021-08-15 ENCOUNTER — Encounter: Payer: Self-pay | Admitting: Obstetrics and Gynecology

## 2021-08-15 VITALS — BP 110/70 | HR 66 | Ht 61.0 in | Wt 125.0 lb

## 2021-08-15 DIAGNOSIS — K5909 Other constipation: Secondary | ICD-10-CM | POA: Diagnosis not present

## 2021-08-15 DIAGNOSIS — N393 Stress incontinence (female) (male): Secondary | ICD-10-CM | POA: Diagnosis not present

## 2021-08-15 DIAGNOSIS — F439 Reaction to severe stress, unspecified: Secondary | ICD-10-CM

## 2021-08-15 NOTE — Progress Notes (Signed)
GYNECOLOGY  VISIT ?  ?HPI: ?61 y.o.   Married  Caucasian  female   ?[id number] with Patient's last menstrual period was 04/04/2014.   ?here to establish care. ? ?She had Gyn AEX 04/2021. Her Gyn retiring.   ? ?Has a chronic cough.  ?Has seen a pulmonologist.  ?Endoscopy showed ulcers, possibly due NSAIDS to treat arthritis.  ? ?Does not like her weight.  ?Thought about seeing a nutritionist.  ? ?Not having regular BMs. Using fleets suppositories 15 - 20 in the am to have a BM. ?She had a 4th degree laceration with childbirth.  ?Sees Dr. Carlean Purl for gastroenterology care.  ?Did pelvic floor therapy which was not helpful. ? ?Some urinary incontinence with cough/sneeze.  ?Thinks it occurs when bladder is full.  ? ?Not sexually active.  ? ?No energy and not sleeping.  ?Stressed.  ?Working until late at night and on weekends.  ?Not working out regularly now. ?Taking care of husband and friends.  ? ?Lives in [place]. 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  ?Husband is a retired Chief Financial Officer, age 12.  ?Sons live away.  ? ?GYNECOLOGIC HISTORY: ?Patient's last menstrual period was 04/04/2014. ?Contraception:  PMP ?Menopausal hormone therapy:  none ?Last mammogram:  05/2021 normal per patient--[facility] ?Last pap smear:   05/2019 normal per patient. Vague history of abnormal pap 30+ years ago ?       ?OB History   ? ? Gravida  ?2  ? Para  ?1  ? Term  ?   ? Preterm  ?   ? AB  ?1  ? Living  ?1  ?  ? ? SAB  ?1  ? IAB  ?   ? Ectopic  ?   ? Multiple  ?   ? Live Births  ?   ?   ?  ?  ?    ? ?Patient Active Problem List  ? Diagnosis Date Noted  ? Primary osteoarthritis of both hands 01/30/2021  ? Chondrocalcinosis 01/30/2021  ? Primary osteoarthritis of both knees 01/30/2021  ? Primary osteoarthritis of both feet 01/30/2021  ? DDD (degenerative disc disease), lumbar 01/30/2021  ? Other idiopathic scoliosis, thoracolumbar region 01/30/2021  ? Chronic insomnia 01/30/2021  ? Other fatigue 01/30/2021  ? Acute gastric ulcer without  hemorrhage or perforation 01/30/2021  ? History of hyperlipidemia 01/30/2021  ? Hx of migraines 01/30/2021  ? Vitamin D deficiency 01/30/2021  ? Osteopenia of multiple sites 01/30/2021  ? Hx of adenomatous polyp of rectum 06/28/2014  ? ? ?Past Medical History:  ?Diagnosis Date  ? Acute right lumbar radiculopathy   ? ANA positive   ? Chronic constipation   ? Chronic cough   ? Chronic insomnia   ? COVID-19   ? Positive antibodies in 2021 suspected infection spring 2021  ? Degeneration of lumbar intervertebral disc   ? Herniated lumbar intervertebral disc   ? Hx of adenomatous polyp of rectum 06/28/2014  ? Hyperlipidemia   ? diet controlled  ? Kidney stones   ? Osteoarthritis   ? Osteoporosis   ? ? ?Past Surgical History:  ?Procedure Laterality Date  ? BREAST ENHANCEMENT SURGERY  2009  ? COLONOSCOPY  2016  ? CG-MAC-movi(good)-rectal polyp  ? epidural steroid injection    ? multiple  ? ESOPHAGOGASTRODUODENOSCOPY  12/25/2020  ? TONSILLECTOMY    ? VULVA SURGERY  2011  ? labial plasty  ? WISDOM TOOTH EXTRACTION    ? ? ?Current Outpatient Medications  ?Medication Sig Dispense  Refill  ? acetaminophen (TYLENOL) 325 MG tablet Tylenol 325 mg tablet ? Take 1 tablet as needed by oral route.    ? Glycerin, Laxative, (FLEET LIQUID GLYCERIN SUPP RE) Place 15 suppositories rectally daily.    ? Pseudoeph-Doxylamine-DM-APAP (NYQUIL PO) Take by mouth as needed.    ? ?No current facility-administered medications for this visit.  ?  ? ?ALLERGIES: Patient has no known allergies. ? ?Family History  ?Problem Relation Age of Onset  ? Thyroid disease Mother   ? Pancreatitis Mother   ?     secondary to EGD perforation  ? Osteoarthritis Mother   ? ALS Father   ? Osteoarthritis Sister   ? Osteoarthritis Brother   ? Osteoarthritis Brother   ? Healthy Son   ? Diabetes Neg Hx   ? Colon cancer Neg Hx   ? Colon polyps Neg Hx   ? Kidney disease Neg Hx   ? Esophageal cancer Neg Hx   ? Stomach cancer Neg Hx   ? Rectal cancer Neg Hx   ? ? ?Social History   ? ?Socioeconomic History  ? Marital status: Married  ?  Spouse name: Not on file  ? Number of children: 1  ? Years of education: Not on file  ? Highest education level: Not on file  ?Occupational History  ? Occupation: Building control surveyor  ?Tobacco Use  ? Smoking status: Never  ? Smokeless tobacco: Never  ?Vaping Use  ? Vaping Use: Never used  ?Substance and Sexual Activity  ? Alcohol use: Not Currently  ?  Alcohol/week: 7.0 standard drinks  ?  Types: 7 Glasses of wine per week  ? Drug use: No  ? Sexual activity: Not Currently  ?  Birth control/protection: Post-menopausal  ?Other Topics Concern  ? Not on file  ?Social History Narrative  ? Married, employed as a Music therapist, she has 1 son, married to an Chief Financial Officer  ? One caffeinated beverage daily  ? ?Social Determinants of Health  ? ?Financial Resource Strain: Not on file  ?Food Insecurity: Not on file  ?Transportation Needs: Not on file  ?Physical Activity: Not on file  ?Stress: Not on file  ?Social Connections: Not on file  ?Intimate Partner Violence: Not on file  ? ? ?Review of Systems  ?All other systems reviewed and are negative. ? ?PHYSICAL EXAMINATION:   ? ?BP 110/70   Pulse 66   Ht _0  (1.549 m)   Wt 125 lb (56.7 kg)   LMP 04/04/2014   SpO2 98%   BMI 23.62 kg/m?     ?General appearance: alert, cooperative and appears stated age ? ?Lungs: clear to auscultation bilaterally ?  ?Heart: regular rate and rhythm ?Abdomen: soft, non-tender, no masses,  no organomegaly ?Extremities: extremities normal, atraumatic, no cyanosis or edema ?No abnormal inguinal nodes palpated ?  ?Pelvic: External genitalia:  no lesions ?             Urethra:  normal appearing urethra with no masses, tenderness or lesions ?             Bartholins and Skenes: normal    ?             Vagina: normal appearing vagina with normal color and discharge, no lesions.  Good support.  ?             Cervix: no lesions ?               ?Bimanual Exam:  Uterus:  normal  size, contour,  position, consistency, mobility, non-tender ?             Adnexa: no mass, fullness, tenderness ?             Rectal exam: Yes.  .  Confirms. ?             Anus:  normal sphincter tone, no lesions ? ?Chaperone was present for exam:  Raquel Sarna, Therapist, sports. ? ?ASSESSMENT ? ?Situational stress.  ?Stress incontinence.  Occasional with bladder full.  ?Chronic constipation.  Linzess offered through PCP.  ? ?PLAN ? ?We talked about obtaining a life coach to help her keep balance in her life with all of the current demands she is managing.  ?Brochure for [employer] counselors to patient.  ?Healthy diet and exercise reviewed along with [employer], Weight Watchers, and nutrition counseling. ?Midurethral sling reviewed. ?Good Rx discussed as a way to obtain prescriptions for potentially less money. ?AEX in December.  ?  ?An After Visit Summary was printed and given to the patient. ? ?48 min  total time was spent for this patient encounter, including preparation, face-to-face counseling with the patient, coordination of care, and documentation of the encounter. ? ? ?

## 2021-08-15 NOTE — Patient Instructions (Signed)
Urinary Incontinence °Urinary incontinence refers to a condition in which a person is unable to control where and when to pass urine. A person with this condition will urinate involuntarily. This means that the person urinates when he or she does not mean to. °What are the causes? °This condition may be caused by: °Medicines. °Infections. °Constipation. °Overactive bladder muscles. °Weak bladder muscles. °Weak pelvic floor muscles. These muscles provide support for the bladder, intestine, and, in women, the uterus. °Enlarged prostate in men. The prostate is a gland near the bladder. When it gets too big, it can pinch the urethra. With the urethra blocked, the bladder can weaken and lose the ability to empty properly. °Surgery. °Emotional factors, such as anxiety, stress, or post-traumatic stress disorder (PTSD). °Spinal cord injury, nerve injury, or other neurological conditions. °Pelvic organ prolapse. This happens in women when organs move out of place and into the vagina. This movement can prevent the bladder and urethra from working properly. °What increases the risk? °The following factors may make you more likely to develop this condition: °Age. The older you are, the higher the risk. °Obesity. °Being physically inactive. °Pregnancy and childbirth. °Menopause. °Diseases that affect the nerves or spinal cord. °Long-term, or chronic, coughing. This can increase pressure on the bladder and pelvic floor muscles. °What are the signs or symptoms? °Symptoms may vary depending on the type of urinary incontinence you have. They include: °A sudden urge to urinate, and passing urine involuntarily before you can get to a bathroom (urge incontinence). °Suddenly passing urine when doing activities that force urine to pass, such as coughing, laughing, exercising, or sneezing (stress incontinence). °Needing to urinate often but urinating only a small amount, or constantly dribbling urine (overflow incontinence). °Urinating  because you cannot get to the bathroom in time due to a physical disability, such as arthritis or injury, or due to a communication or thinking problem, such as Alzheimer's disease (functional incontinence). °How is this diagnosed? °This condition may be diagnosed based on: °Your medical history. °A physical exam. °Tests, such as: °Urine tests. °X-rays of your kidney and bladder. °Ultrasound. °CT scan. °Cystoscopy. In this procedure, a health care provider inserts a tube with a light and camera (cystoscope) through the urethra and into the bladder to check for problems. °Urodynamic testing. These tests assess how well the bladder, urethra, and sphincter can store and release urine. There are different types of urodynamic tests, and they vary depending on what the test is measuring. °To help diagnose your condition, your health care provider may recommend that you keep a log of when you urinate and how much you urinate. °How is this treated? °Treatment for this condition depends on the type of incontinence that you have and its cause. Treatment may include: °Lifestyle changes, such as: °Quitting smoking. °Maintaining a healthy weight. °Staying active. Try to get 150 minutes of moderate-intensity exercise every week. Ask your health care provider which activities are safe for you. °Eating a healthy diet. °Avoid high-fat foods, like fried foods. °Avoid refined carbohydrates like white bread and white rice. °Limit how much alcohol and caffeine you drink. °Increase your fiber intake. Healthy sources of fiber include beans, whole grains, and fresh fruits and vegetables. °Behavioral changes, such as: °Pelvic floor muscle exercises. °Bladder training, such as lengthening the amount of time between bathroom breaks, or using the bathroom at regular intervals. °Using techniques to suppress bladder urges. This can include distraction techniques or controlled breathing exercises. °Medicines, such as: °Medicines to relax the  bladder   muscles and prevent bladder spasms. °Medicines to help slow or prevent the growth of a man's prostate. °Botox injections. These can help relax the bladder muscles. °Treatments, such as: °Using pulses of electricity to help change bladder reflexes (electrical nerve stimulation). °For women, using a medical device to prevent urine leaks. This is a small, tampon-like, disposable device that is inserted into the urethra. °Injecting collagen or carbon beads (bulking agents) into the urinary sphincter. These can help thicken tissue and close the bladder opening. °Surgery. °Follow these instructions at home: °Lifestyle °Limit alcohol and caffeine. These can fill your bladder quickly and irritate it. °Keep yourself clean to help prevent odors and skin damage. Ask your health care provider about special skin creams and cleansers that can protect the skin from urine. °Consider wearing pads or adult diapers. Make sure to change them regularly, and always change them right after experiencing incontinence. °General instructions °Take over-the-counter and prescription medicines only as told by your health care provider. °Use the bathroom about every 3-4 hours, even if you do not feel the need to urinate. Try to empty your bladder completely every time. After urinating, wait a minute. Then try to urinate again. °Make sure you are in a relaxed position while urinating. °If your incontinence is caused by nerve problems, keep a log of the medicines you take and the times you go to the bathroom. °Keep all follow-up visits. This is important. °Where to find more information °National Institute of Diabetes and Digestive and Kidney Diseases: www.niddk.nih.gov °American Urology Association: www.urologyhealth.org °Contact a health care provider if: °You have pain that gets worse. °Your incontinence gets worse. °Get help right away if: °You have a fever or chills. °You are unable to urinate. °You have redness in your groin area or  down your legs. °Summary °Urinary incontinence refers to a condition in which a person is unable to control where and when to pass urine. °This condition may be caused by medicines, infection, weak bladder muscles, weak pelvic floor muscles, enlargement of the prostate (in men), or surgery. °Factors such as older age, obesity, pregnancy and childbirth, menopause, neurological diseases, and chronic coughing may increase your risk for developing this condition. °Types of urinary incontinence include urge incontinence, stress incontinence, overflow incontinence, and functional incontinence. °This condition is usually treated first with lifestyle and behavioral changes, such as quitting smoking, eating a healthier diet, and doing regular pelvic floor exercises. Other treatment options include medicines, bulking agents, medical devices, electrical nerve stimulation, or surgery. °This information is not intended to replace advice given to you by your health care provider. Make sure you discuss any questions you have with your health care provider. °Document Revised: 12/16/2019 Document Reviewed: 12/16/2019 °Elsevier Patient Education © 2022 Elsevier Inc. ° °

## 2021-09-05 ENCOUNTER — Encounter: Payer: Self-pay | Admitting: Obstetrics and Gynecology

## 2021-09-09 DIAGNOSIS — K219 Gastro-esophageal reflux disease without esophagitis: Secondary | ICD-10-CM | POA: Diagnosis not present

## 2021-09-09 DIAGNOSIS — R053 Chronic cough: Secondary | ICD-10-CM | POA: Diagnosis not present

## 2021-10-10 DIAGNOSIS — F5104 Psychophysiologic insomnia: Secondary | ICD-10-CM | POA: Diagnosis not present

## 2021-10-10 DIAGNOSIS — K5909 Other constipation: Secondary | ICD-10-CM | POA: Diagnosis not present

## 2021-10-10 DIAGNOSIS — K259 Gastric ulcer, unspecified as acute or chronic, without hemorrhage or perforation: Secondary | ICD-10-CM | POA: Insufficient documentation

## 2021-10-10 DIAGNOSIS — R053 Chronic cough: Secondary | ICD-10-CM | POA: Diagnosis not present

## 2021-10-10 DIAGNOSIS — R768 Other specified abnormal immunological findings in serum: Secondary | ICD-10-CM | POA: Insufficient documentation

## 2021-10-10 DIAGNOSIS — Z0000 Encounter for general adult medical examination without abnormal findings: Secondary | ICD-10-CM | POA: Diagnosis not present

## 2021-10-10 DIAGNOSIS — M1990 Unspecified osteoarthritis, unspecified site: Secondary | ICD-10-CM | POA: Insufficient documentation

## 2021-10-17 DIAGNOSIS — M25561 Pain in right knee: Secondary | ICD-10-CM | POA: Diagnosis not present

## 2021-10-17 DIAGNOSIS — M25562 Pain in left knee: Secondary | ICD-10-CM | POA: Diagnosis not present

## 2021-10-23 DIAGNOSIS — J309 Allergic rhinitis, unspecified: Secondary | ICD-10-CM | POA: Diagnosis not present

## 2021-10-23 DIAGNOSIS — R053 Chronic cough: Secondary | ICD-10-CM | POA: Diagnosis not present

## 2022-01-25 IMAGING — CT CT CHEST HIGH RESOLUTION W/O CM
3 of 5 series · 14 of 36 positions shown, 15 images · non-contrast
Comparison: None.

CLINICAL DATA: Productive cough, fatigue, headache, COVID
antibodies

EXAM:
CT CHEST WITHOUT CONTRAST
TECHNIQUE: Multidetector CT imaging of the chest was performed following the
standard protocol without intravenous contrast. High resolution
imaging of the lungs, as well as inspiratory and expiratory imaging,
was performed.

[Series 3: standard chest · axial · 0.54mm/px · z∈[+1190,+1412]mm · 8 of 137 slices shown]
[im 13/137  mediastinal]
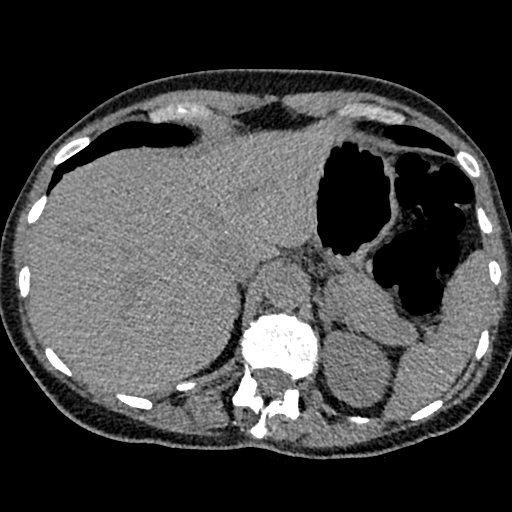
[im 26/137  mediastinal]
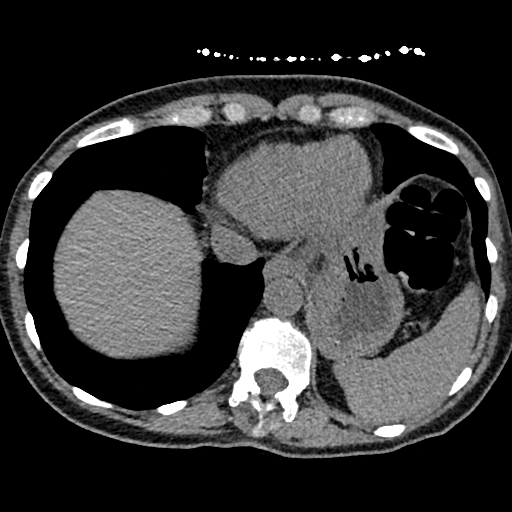
[im 46/137  mediastinal]
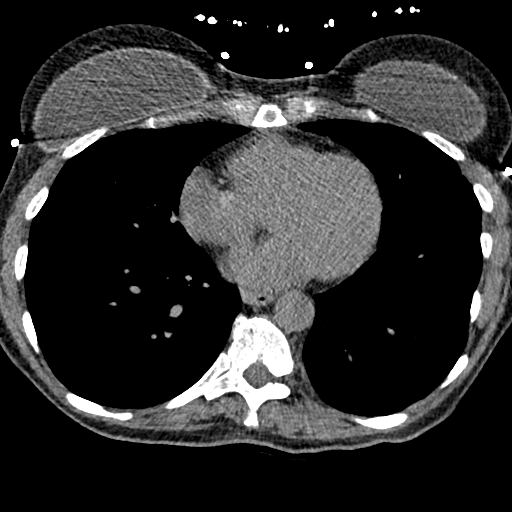
[im 59/137  mediastinal]
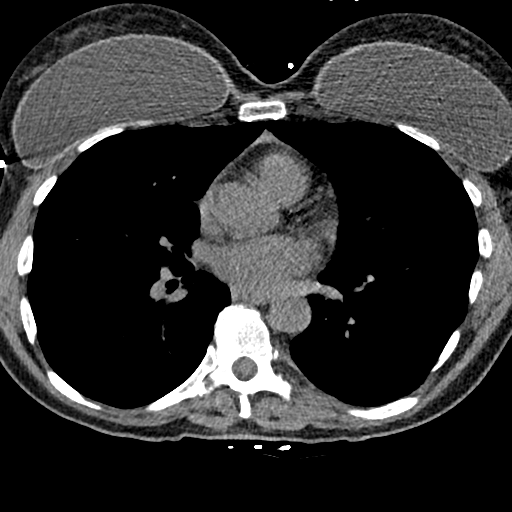
[im 78/137  mediastinal]
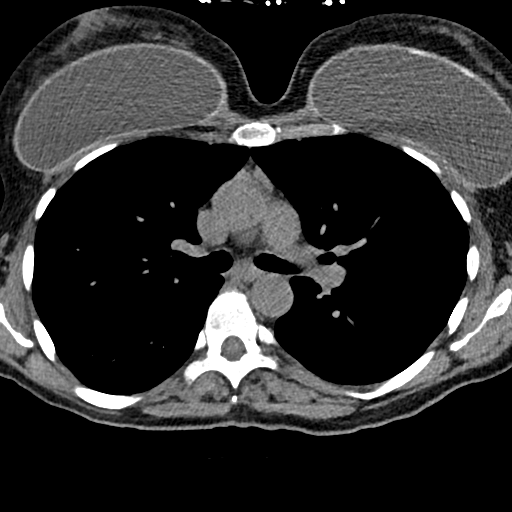
[im 91/137  mediastinal]
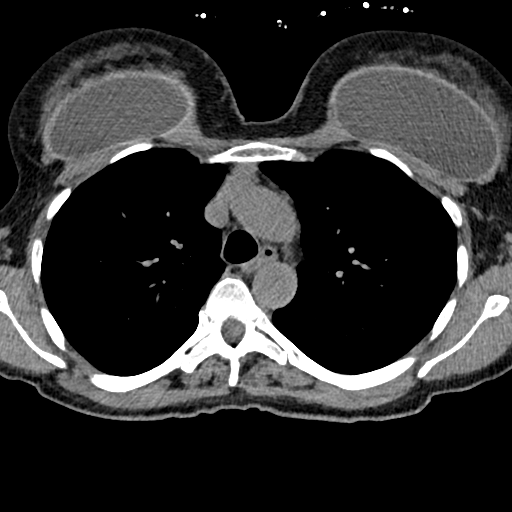
[im 111/137  mediastinal]
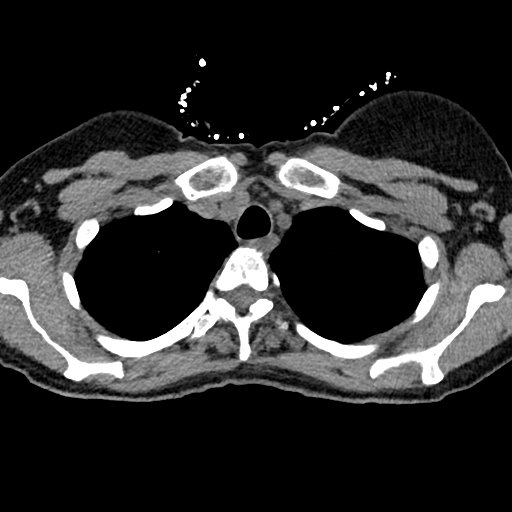
[im 124/137  mediastinal]
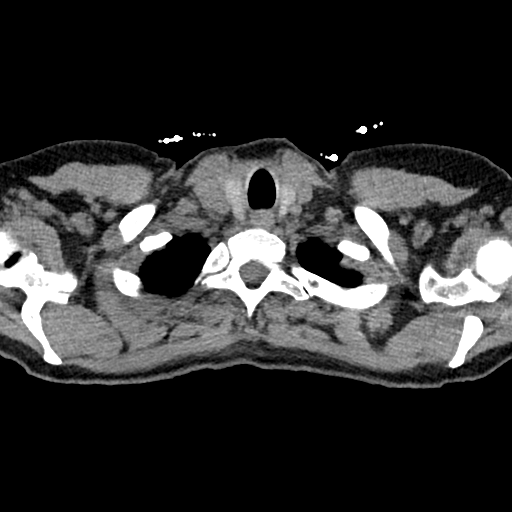

[Series 4: high res insp · axial · 0.54mm/px · z∈[+1164,+1435]mm · 3 of 20 slices shown, 4 images]
[im 1/20  mediastinal]
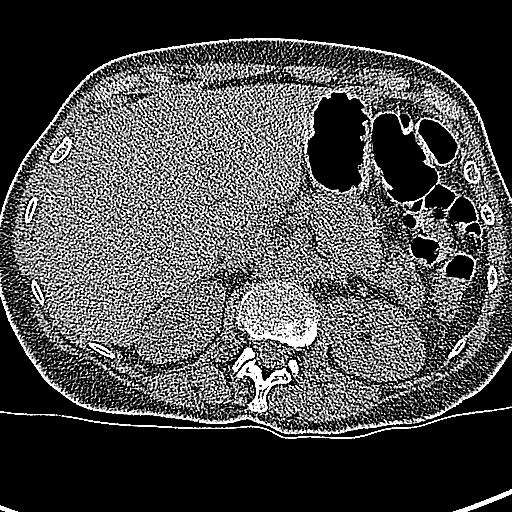
[im 1/20  lung]
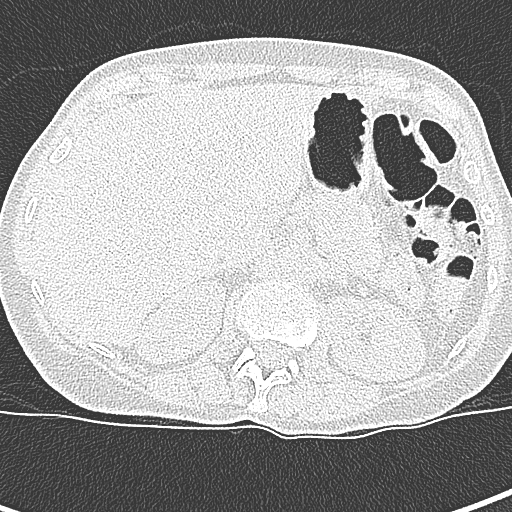
[im 10/20  lung]
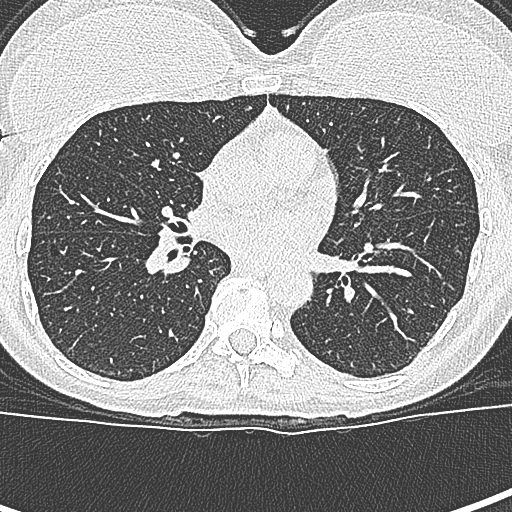
[im 20/20  lung]
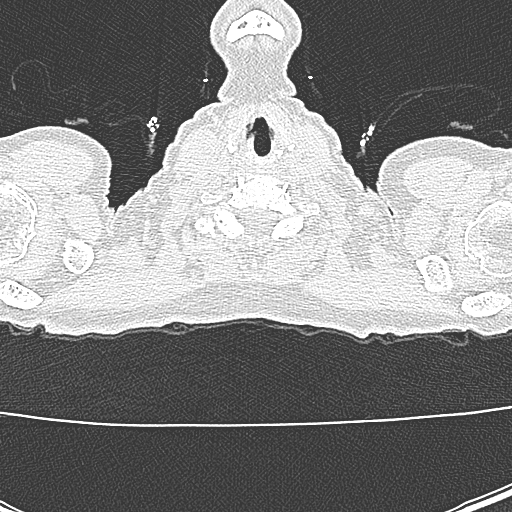

[Series 9: coronal · coronal · 0.56mm/px · 3 of 128 slices shown]
[im 26/128  lung]
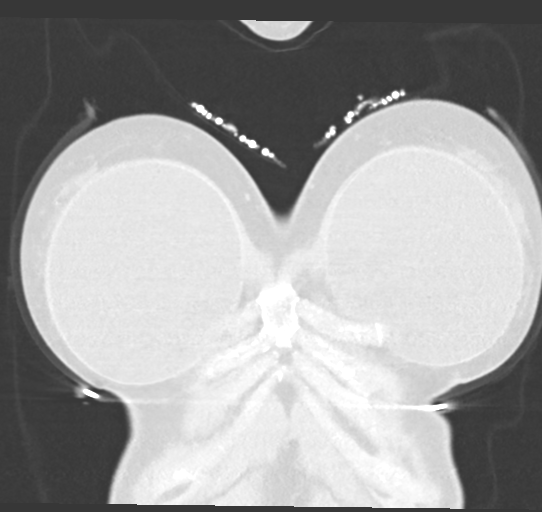
[im 51/128  lung]
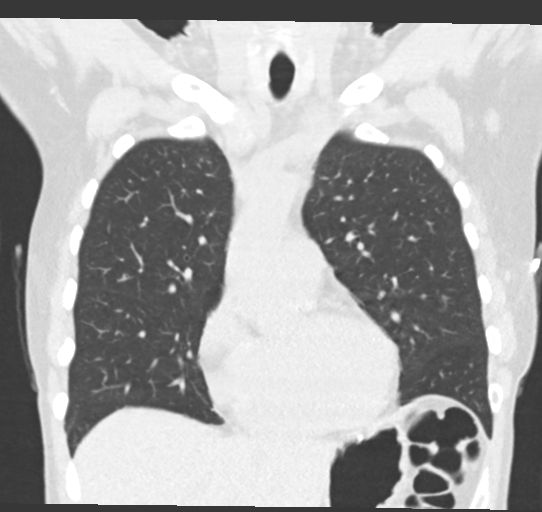
[im 77/128  lung]
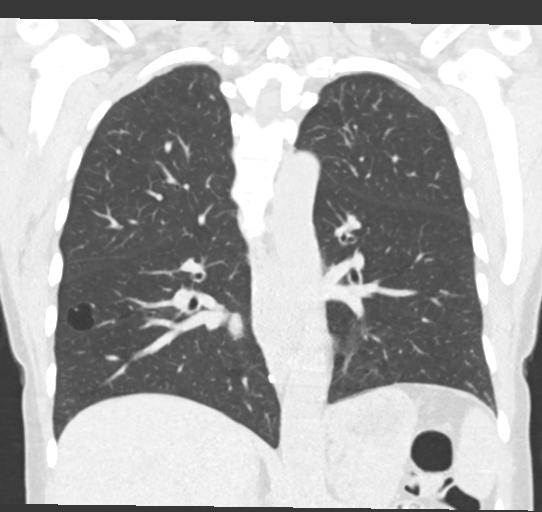

[14 of 36 positions shown; findings below may reference images not displayed]

FINDINGS: Cardiovascular: No significant vascular findings. Normal heart size.
No pericardial effusion.

Mediastinum/Nodes: No enlarged mediastinal, hilar, or axillary lymph
nodes. Thyroid gland, trachea, and esophagus demonstrate no
significant findings.

Lungs/Pleura: Occasional small pulmonary nodules of the lung apices,
largest a 3 mm nodule of the left upper lobe (series 7, image 40).
No significant air trapping on expiratory phase imaging no pleural
effusion or pneumothorax.

Upper Abdomen: No acute abnormality.

Musculoskeletal: No chest wall mass or suspicious bone lesions
identified. Bilateral breast implants.
IMPRESSION: 1. No evidence of fibrotic interstitial lung disease. No significant
air trapping on expiratory phase imaging.
2. Occasional small pulmonary nodules of the lung apices, largest a
3 mm nodule of the left upper lobe. No follow-up needed if patient
is low-risk (and has no known or suspected primary neoplasm).
Non-contrast chest CT can be considered in 12 months if patient is
high-risk. This recommendation follows the consensus statement:
Guidelines for Management of Incidental Pulmonary Nodules Detected

## 2022-02-10 DIAGNOSIS — E785 Hyperlipidemia, unspecified: Secondary | ICD-10-CM | POA: Diagnosis not present

## 2022-02-27 DIAGNOSIS — L282 Other prurigo: Secondary | ICD-10-CM | POA: Diagnosis not present

## 2022-02-27 DIAGNOSIS — F419 Anxiety disorder, unspecified: Secondary | ICD-10-CM | POA: Diagnosis not present

## 2022-04-29 NOTE — Progress Notes (Deleted)
61 y.o. [id number] Married {Race/ethnicity:17218} female here for annual exam.    PCP:     Patient's last menstrual period was 04/04/2014.           Sexually active: {yes no:314532}  The current method of family planning is post menopausal status.    Exercising: {yes no:314532}  {types:19826} Smoker:  {YES [id number]  Health Maintenance: Pap:   05/2019 normal per patient. Vague history of abnormal pap 30+ years ago  History of abnormal Pap:  yes MMG:  05/24/21, Breast Composition Category C, BI-RADS CATEGORY 2 Benign Colonoscopy:  12/25/20 BMD:   01/11/21  Result  osteopenia TDaP:  *** Gardasil:   {YES NO:22349} HIV: Hep C: Screening Labs:  Hb today: ***, Urine today: ***   reports that she has never smoked. She has never used smokeless tobacco. She reports that she does not currently use alcohol after a past usage of about 7.0 standard drinks of alcohol per week. She reports that she does not use drugs.  Past Medical History:  Diagnosis Date   Acute right lumbar radiculopathy    ANA positive    Chronic constipation    Chronic cough    Chronic insomnia    COVID-19    Positive antibodies in 2021 suspected infection spring 2021   Degeneration of lumbar intervertebral disc    Herniated lumbar intervertebral disc    Hx of adenomatous polyp of rectum 06/28/2014   Hyperlipidemia    diet controlled   Kidney stones    Osteoarthritis    Osteoporosis     Past Surgical History:  Procedure Laterality Date   BREAST ENHANCEMENT SURGERY  2009   COLONOSCOPY  2016   CG-MAC-movi(good)-rectal polyp   epidural steroid injection     multiple   ESOPHAGOGASTRODUODENOSCOPY  12/25/2020   TONSILLECTOMY     VULVA SURGERY  2011   labial plasty   WISDOM TOOTH EXTRACTION      Current Outpatient Medications  Medication Sig Dispense Refill   acetaminophen (TYLENOL) 325 MG tablet Tylenol 325 mg tablet  Take 1 tablet as needed by oral route.     Glycerin, Laxative, (FLEET LIQUID GLYCERIN SUPP  RE) Place 15 suppositories rectally daily.     Pseudoeph-Doxylamine-DM-APAP (NYQUIL PO) Take by mouth as needed.     No current facility-administered medications for this visit.    Family History  Problem Relation Age of Onset   Thyroid disease Mother    Pancreatitis Mother        secondary to EGD perforation   Osteoarthritis Mother    ALS Father    Osteoarthritis Sister    Osteoarthritis Brother    Osteoarthritis Brother    Healthy Son    Diabetes Neg Hx    Colon cancer Neg Hx    Colon polyps Neg Hx    Kidney disease Neg Hx    Esophageal cancer Neg Hx    Stomach cancer Neg Hx    Rectal cancer Neg Hx     Review of Systems  Exam:   LMP 04/04/2014     General appearance: alert, cooperative and appears stated age Head: normocephalic, without obvious abnormality, atraumatic Neck: no adenopathy, supple, symmetrical, trachea midline and thyroid normal to inspection and palpation Lungs: clear to auscultation bilaterally Breasts: normal appearance, no masses or tenderness, No nipple retraction or dimpling, No nipple discharge or bleeding, No axillary adenopathy Heart: regular rate and rhythm Abdomen: soft, non-tender; no masses, no organomegaly Extremities: extremities normal, atraumatic, no cyanosis or edema Skin: skin  color, texture, turgor normal. No rashes or lesions Lymph nodes: cervical, supraclavicular, and axillary nodes normal. Neurologic: grossly normal  Pelvic: External genitalia:  no lesions              No abnormal inguinal nodes palpated.              Urethra:  normal appearing urethra with no masses, tenderness or lesions              Bartholins and Skenes: normal                 Vagina: normal appearing vagina with normal color and discharge, no lesions              Cervix: no lesions              Pap taken: {yes no:314532} Bimanual Exam:  Uterus:  normal size, contour, position, consistency, mobility, non-tender              Adnexa: no mass, fullness,  tenderness              Rectal exam: {yes no:314532}.  Confirms.              Anus:  normal sphincter tone, no lesions  Chaperone was present for exam:  ***  Assessment:   Well woman visit with gynecologic exam.   Plan: Mammogram screening discussed. Self breast awareness reviewed. Pap and HR HPV as above. Guidelines for Calcium, Vitamin D, regular exercise program including cardiovascular and weight bearing exercise.   Follow up annually and prn.   Additional counseling given.  {yes [id number]. _______ minutes face to face time of which over 50% was spent in counseling.    After visit summary provided.

## 2022-05-08 ENCOUNTER — Ambulatory Visit: Payer: BC Managed Care – PPO | Admitting: Obstetrics and Gynecology

## 2022-05-12 DIAGNOSIS — H04122 Dry eye syndrome of left lacrimal gland: Secondary | ICD-10-CM | POA: Diagnosis not present

## 2022-05-12 DIAGNOSIS — H04121 Dry eye syndrome of right lacrimal gland: Secondary | ICD-10-CM | POA: Diagnosis not present

## 2022-05-12 DIAGNOSIS — H16122 Filamentary keratitis, left eye: Secondary | ICD-10-CM | POA: Diagnosis not present

## 2022-05-30 DIAGNOSIS — Z1231 Encounter for screening mammogram for malignant neoplasm of breast: Secondary | ICD-10-CM | POA: Diagnosis not present

## 2022-06-04 ENCOUNTER — Encounter: Payer: Self-pay | Admitting: Obstetrics and Gynecology

## 2022-06-04 NOTE — Progress Notes (Deleted)
62 y.o. G27P0011 Married {Race/ethnicity:[id number]} female here for annual exam.    PCP:     Patient's last menstrual period was 04/04/2014.           Sexually active: {yes no:314532}  The current method of family planning is post menopausal status.    Exercising: {yes no:314532}  {types:19826} Smoker:  {YES [id number]  Health Maintenance: Pap:   05/2019 normal per patient. Vague history of abnormal pap 30+ years ago  History of abnormal Pap:  yes MMG:  05/2021 normal per patient--[facility]  Colonoscopy:  12/25/20 BMD:   01/11/21  Result  osteopenia TDaP:  *** Gardasil:   no HIV: Hep C: Screening Labs:  Hb today: ***, Urine today: ***   reports that she has never smoked. She has never used smokeless tobacco. She reports that she does not currently use alcohol after a past usage of about 7.0 standard drinks of alcohol per week. She reports that she does not use drugs.  Past Medical History:  Diagnosis Date   Acute right lumbar radiculopathy    ANA positive    Chronic constipation    Chronic cough    Chronic insomnia    COVID-19    Positive antibodies in 2021 suspected infection spring 2021   Degeneration of lumbar intervertebral disc    Herniated lumbar intervertebral disc    Hx of adenomatous polyp of rectum 06/28/2014   Hyperlipidemia    diet controlled   Kidney stones    Osteoarthritis    Osteoporosis     Past Surgical History:  Procedure Laterality Date   BREAST ENHANCEMENT SURGERY  2009   COLONOSCOPY  2016   CG-MAC-movi(good)-rectal polyp   epidural steroid injection     multiple   ESOPHAGOGASTRODUODENOSCOPY  12/25/2020   TONSILLECTOMY     VULVA SURGERY  2011   labial plasty   WISDOM TOOTH EXTRACTION      Current Outpatient Medications  Medication Sig Dispense Refill   acetaminophen (TYLENOL) 325 MG tablet Tylenol 325 mg tablet  Take 1 tablet as needed by oral route.     Glycerin, Laxative, (FLEET LIQUID GLYCERIN SUPP RE) Place 15 suppositories rectally daily.      Pseudoeph-Doxylamine-DM-APAP (NYQUIL PO) Take by mouth as needed.     No current facility-administered medications for this visit.    Family History  Problem Relation Age of Onset   Thyroid disease Mother    Pancreatitis Mother        secondary to EGD perforation   Osteoarthritis Mother    ALS Father    Osteoarthritis Sister    Osteoarthritis Brother    Osteoarthritis Brother    Healthy Son    Diabetes Neg Hx    Colon cancer Neg Hx    Colon polyps Neg Hx    Kidney disease Neg Hx    Esophageal cancer Neg Hx    Stomach cancer Neg Hx    Rectal cancer Neg Hx     Review of Systems  Exam:   LMP 04/04/2014     General appearance: alert, cooperative and appears stated age Head: normocephalic, without obvious abnormality, atraumatic Neck: no adenopathy, supple, symmetrical, trachea midline and thyroid normal to inspection and palpation Lungs: clear to auscultation bilaterally Breasts: normal appearance, no masses or tenderness, No nipple retraction or dimpling, No nipple discharge or bleeding, No axillary adenopathy Heart: regular rate and rhythm Abdomen: soft, non-tender; no masses, no organomegaly Extremities: extremities normal, atraumatic, no cyanosis or edema Skin: skin color, texture, turgor normal. No  rashes or lesions Lymph nodes: cervical, supraclavicular, and axillary nodes normal. Neurologic: grossly normal  Pelvic: External genitalia:  no lesions              No abnormal inguinal nodes palpated.              Urethra:  normal appearing urethra with no masses, tenderness or lesions              Bartholins and Skenes: normal                 Vagina: normal appearing vagina with normal color and discharge, no lesions              Cervix: no lesions              Pap taken: {yes no:314532} Bimanual Exam:  Uterus:  normal size, contour, position, consistency, mobility, non-tender              Adnexa: no mass, fullness, tenderness              Rectal exam: {yes  no:314532}.  Confirms.              Anus:  normal sphincter tone, no lesions  Chaperone was present for exam:  ***  Assessment:   Well woman visit with gynecologic exam.   Plan: Mammogram screening discussed. Self breast awareness reviewed. Pap and HR HPV as above. Guidelines for Calcium, Vitamin D, regular exercise program including cardiovascular and weight bearing exercise.   Follow up annually and prn.   Additional counseling given.  {yes [id number]. _______ minutes face to face time of which over 50% was spent in counseling.    After visit summary provided.

## 2022-06-17 ENCOUNTER — Ambulatory Visit: Payer: BC Managed Care – PPO | Admitting: Obstetrics and Gynecology

## 2022-07-03 DIAGNOSIS — M170 Bilateral primary osteoarthritis of knee: Secondary | ICD-10-CM | POA: Diagnosis not present

## 2022-07-04 DIAGNOSIS — M1712 Unilateral primary osteoarthritis, left knee: Secondary | ICD-10-CM | POA: Insufficient documentation

## 2022-07-04 DIAGNOSIS — M1711 Unilateral primary osteoarthritis, right knee: Secondary | ICD-10-CM | POA: Insufficient documentation

## 2022-07-14 DIAGNOSIS — L03011 Cellulitis of right finger: Secondary | ICD-10-CM | POA: Diagnosis not present

## 2022-07-22 NOTE — Progress Notes (Deleted)
62 y.o. G94P0011 Married {Race/ethnicity:17218} female here for annual exam.    PCP:     Patient's last menstrual period was 04/04/2014.           Sexually active: {yes no:314532}  The current method of family planning is post menopausal status.    Exercising: {yes no:314532}  {types:19826} Smoker:  {YES [id number]  Health Maintenance: Pap:  05/2019 normal per patient.  History of abnormal Pap:  yes,Vague history of abnormal pap 30+ years ago  MMG:  05/2021 normal per patient--[facility]  Colonoscopy:  12/25/20 BMD:   01/11/21  Result  osteopenia TDaP:  *** Gardasil:   {YES NO:22349} HIV: Hep C: Screening Labs:  Hb today: ***, Urine today: ***   reports that she has never smoked. She has never used smokeless tobacco. She reports that she does not currently use alcohol after a past usage of about 7.0 standard drinks of alcohol per week. She reports that she does not use drugs.  Past Medical History:  Diagnosis Date   Acute right lumbar radiculopathy    ANA positive    Chronic constipation    Chronic cough    Chronic insomnia    COVID-19    Positive antibodies in 2021 suspected infection spring 2021   Degeneration of lumbar intervertebral disc    Herniated lumbar intervertebral disc    Hx of adenomatous polyp of rectum 06/28/2014   Hyperlipidemia    diet controlled   Kidney stones    Osteoarthritis    Osteoporosis     Past Surgical History:  Procedure Laterality Date   BREAST ENHANCEMENT SURGERY  2009   COLONOSCOPY  2016   CG-MAC-movi(good)-rectal polyp   epidural steroid injection     multiple   ESOPHAGOGASTRODUODENOSCOPY  12/25/2020   TONSILLECTOMY     VULVA SURGERY  2011   labial plasty   WISDOM TOOTH EXTRACTION      Current Outpatient Medications  Medication Sig Dispense Refill   acetaminophen (TYLENOL) 325 MG tablet Tylenol 325 mg tablet  Take 1 tablet as needed by oral route.     Glycerin, Laxative, (FLEET LIQUID GLYCERIN SUPP RE) Place 15 suppositories rectally  daily.     Pseudoeph-Doxylamine-DM-APAP (NYQUIL PO) Take by mouth as needed.     No current facility-administered medications for this visit.    Family History  Problem Relation Age of Onset   Thyroid disease Mother    Pancreatitis Mother        secondary to EGD perforation   Osteoarthritis Mother    ALS Father    Osteoarthritis Sister    Osteoarthritis Brother    Osteoarthritis Brother    Healthy Son    Diabetes Neg Hx    Colon cancer Neg Hx    Colon polyps Neg Hx    Kidney disease Neg Hx    Esophageal cancer Neg Hx    Stomach cancer Neg Hx    Rectal cancer Neg Hx     Review of Systems  Exam:   LMP 04/04/2014     General appearance: alert, cooperative and appears stated age Head: normocephalic, without obvious abnormality, atraumatic Neck: no adenopathy, supple, symmetrical, trachea midline and thyroid normal to inspection and palpation Lungs: clear to auscultation bilaterally Breasts: normal appearance, no masses or tenderness, No nipple retraction or dimpling, No nipple discharge or bleeding, No axillary adenopathy Heart: regular rate and rhythm Abdomen: soft, non-tender; no masses, no organomegaly Extremities: extremities normal, atraumatic, no cyanosis or edema Skin: skin color, texture, turgor normal. No  rashes or lesions Lymph nodes: cervical, supraclavicular, and axillary nodes normal. Neurologic: grossly normal  Pelvic: External genitalia:  no lesions              No abnormal inguinal nodes palpated.              Urethra:  normal appearing urethra with no masses, tenderness or lesions              Bartholins and Skenes: normal                 Vagina: normal appearing vagina with normal color and discharge, no lesions              Cervix: no lesions              Pap taken: {yes no:314532} Bimanual Exam:  Uterus:  normal size, contour, position, consistency, mobility, non-tender              Adnexa: no mass, fullness, tenderness              Rectal exam: {yes  no:314532}.  Confirms.              Anus:  normal sphincter tone, no lesions  Chaperone was present for exam:  ***  Assessment:   Well woman visit with gynecologic exam.   Plan: Mammogram screening discussed. Self breast awareness reviewed. Pap and HR HPV as above. Guidelines for Calcium, Vitamin D, regular exercise program including cardiovascular and weight bearing exercise.   Follow up annually and prn.   Additional counseling given.  {yes [id number]. _______ minutes face to face time of which over 50% was spent in counseling.    After visit summary provided.

## 2022-08-05 ENCOUNTER — Ambulatory Visit: Payer: BC Managed Care – PPO | Admitting: Obstetrics and Gynecology

## 2022-08-18 DIAGNOSIS — H04122 Dry eye syndrome of left lacrimal gland: Secondary | ICD-10-CM | POA: Diagnosis not present

## 2022-08-18 DIAGNOSIS — H04121 Dry eye syndrome of right lacrimal gland: Secondary | ICD-10-CM | POA: Diagnosis not present

## 2022-08-18 DIAGNOSIS — H0288A Meibomian gland dysfunction right eye, upper and lower eyelids: Secondary | ICD-10-CM | POA: Diagnosis not present

## 2022-08-18 DIAGNOSIS — H0288B Meibomian gland dysfunction left eye, upper and lower eyelids: Secondary | ICD-10-CM | POA: Diagnosis not present

## 2022-08-20 DIAGNOSIS — H04123 Dry eye syndrome of bilateral lacrimal glands: Secondary | ICD-10-CM | POA: Diagnosis not present

## 2022-08-21 DIAGNOSIS — M170 Bilateral primary osteoarthritis of knee: Secondary | ICD-10-CM | POA: Diagnosis not present

## 2022-08-26 NOTE — Progress Notes (Signed)
62 y.o. G48P0011 Married Caucasian female here for annual exam.    Lost  20 pounds.  Working full time and taking care of her husband.   Has a stomach ulcer.   Seeing orthopedist for knee pain.  Has bone on bone.  Doing gel injections.   Husband has illness and requires nursing care at home during the week. He was recently diagnosed with colon cancer.   Patient is a Firefighter.  PCP:   Dr. Jonelle Sports  Patient's last menstrual period was 04/04/2014.           Sexually active: No.  The current method of family planning is post menopausal status.    Exercising: No.     Smoker:  no  Health Maintenance: Pap:  05/2019 normal per patient.  She is uncertain if she had HR HPV testing done.  History of abnormal Pap:  yes, Vague history of abnormal pap 30+ years ago  MMG:  05/30/22 Breast Density Category C, BI-RADS CAT 2 benign Colonoscopy:  06/23/14 polyp.  Was due in 2021.  She will call Dr. Marvell Fuller office to schedule her colonoscopy.  BMD:   01/11/21  Result  osteopenia bilateral hips.  [facility]. TDaP:  unsure Gardasil:   no HIV: n/a Hep C: n/a Screening Labs:  PCP   reports that she has never smoked. She has never used smokeless tobacco. She reports that she does not currently use alcohol after a past usage of about 7.0 standard drinks of alcohol per week. She reports that she does not use drugs.  Past Medical History:  Diagnosis Date   Acute right lumbar radiculopathy    ANA positive    Chronic constipation    Chronic cough    Chronic insomnia    COVID-19    Positive antibodies in 2021 suspected infection spring 2021   Degeneration of lumbar intervertebral disc    Herniated lumbar intervertebral disc    Hx of adenomatous polyp of rectum 06/28/2014   Hyperlipidemia    diet controlled   Kidney stones    Osteoarthritis    Osteoporosis     Past Surgical History:  Procedure Laterality Date   BREAST ENHANCEMENT SURGERY  2009   COLONOSCOPY  2016    CG-MAC-movi(good)-rectal polyp   epidural steroid injection     multiple   ESOPHAGOGASTRODUODENOSCOPY  12/25/2020   TONSILLECTOMY     VULVA SURGERY  2011   labial plasty   WISDOM TOOTH EXTRACTION      Current Outpatient Medications  Medication Sig Dispense Refill   Glycerin, Laxative, (FLEET LIQUID GLYCERIN SUPP RE) Place 15 suppositories rectally daily.     acetaminophen (TYLENOL) 325 MG tablet Tylenol 325 mg tablet  Take 1 tablet as needed by oral route. (Patient not taking: Reported on 09/09/2022)     Pseudoeph-Doxylamine-DM-APAP (NYQUIL PO) Take by mouth as needed. (Patient not taking: Reported on 09/09/2022)     No current facility-administered medications for this visit.    Family History  Problem Relation Age of Onset   Thyroid disease Mother    Pancreatitis Mother        secondary to EGD perforation   Osteoarthritis Mother    ALS Father    Osteoarthritis Sister    Osteoarthritis Brother    Osteoarthritis Brother    Healthy Son    Diabetes Neg Hx    Colon cancer Neg Hx    Colon polyps Neg Hx    Kidney disease Neg Hx    Esophageal cancer Neg Hx  Stomach cancer Neg Hx    Rectal cancer Neg Hx     Review of Systems  All other systems reviewed and are negative.   Exam:   BP 126/76 (BP Location: Right Arm, Patient Position: Sitting, Cuff Size: Normal)   Pulse 68   Ht 5\' 1"  (1.549 m)   Wt 102 lb (46.3 kg)   LMP 04/04/2014   SpO2 96%   BMI 19.27 kg/m     General appearance: alert, cooperative and appears stated age Head: normocephalic, without obvious abnormality, atraumatic Neck: no adenopathy, supple, symmetrical, trachea midline and thyroid normal to inspection and palpation Lungs: clear to auscultation bilaterally Breasts: bilateral implants, no masses or tenderness, No nipple retraction or dimpling, No nipple discharge or bleeding, No axillary adenopathy Heart: regular rate and rhythm Abdomen: soft, non-tender; no masses, no organomegaly Extremities:  extremities normal, atraumatic, no cyanosis or edema Skin: skin color, texture, turgor normal. No rashes or lesions Lymph nodes: cervical, supraclavicular, and axillary nodes normal. Neurologic: grossly normal  Pelvic: External genitalia:  no lesions              No abnormal inguinal nodes palpated.              Urethra:  normal appearing urethra with no masses, tenderness or lesions              Bartholins and Skenes: normal                 Vagina: normal appearing vagina with normal color and discharge, no lesions              Cervix: no lesions              Pap taken: yes Bimanual Exam:  Uterus:  normal size, contour, position, consistency, mobility, non-tender              Adnexa: no mass, fullness, tenderness              Rectal exam: yes.  Confirms.              Anus:  normal sphincter tone, no lesions  Chaperone was present for exam:  Warren Lacy, CMA  Assessment:   Well woman visit with gynecologic exam. Bilateral breast implants.  Cervical cancer screening.  Osteopenia of bilateral hips.  Plan: Mammogram screening discussed. Self breast awareness reviewed. Pap and reflex HR HPV testing.  Guidelines for Calcium, Vitamin D, regular exercise program including cardiovascular and weight bearing exercise. She will schedule a bone density at [facility] for late this summer.  Follow up annually and prn.   After visit summary provided.

## 2022-08-28 DIAGNOSIS — M170 Bilateral primary osteoarthritis of knee: Secondary | ICD-10-CM | POA: Diagnosis not present

## 2022-09-04 DIAGNOSIS — M170 Bilateral primary osteoarthritis of knee: Secondary | ICD-10-CM | POA: Diagnosis not present

## 2022-09-09 ENCOUNTER — Ambulatory Visit (INDEPENDENT_AMBULATORY_CARE_PROVIDER_SITE_OTHER): Payer: BC Managed Care – PPO | Admitting: Obstetrics and Gynecology

## 2022-09-09 ENCOUNTER — Encounter: Payer: Self-pay | Admitting: Obstetrics and Gynecology

## 2022-09-09 ENCOUNTER — Other Ambulatory Visit (HOSPITAL_COMMUNITY)
Admission: RE | Admit: 2022-09-09 | Discharge: 2022-09-09 | Disposition: A | Discharge status: Home / Self Care | Payer: BC Managed Care – PPO | Source: Ambulatory Visit | Attending: Obstetrics and Gynecology | Admitting: Obstetrics and Gynecology

## 2022-09-09 VITALS — BP 126/76 | HR 68 | Ht 61.0 in | Wt 102.0 lb

## 2022-09-09 DIAGNOSIS — Z124 Encounter for screening for malignant neoplasm of cervix: Secondary | ICD-10-CM | POA: Insufficient documentation

## 2022-09-09 DIAGNOSIS — Z01419 Encounter for gynecological examination (general) (routine) without abnormal findings: Secondary | ICD-10-CM

## 2022-09-09 DIAGNOSIS — M8589 Other specified disorders of bone density and structure, multiple sites: Secondary | ICD-10-CM | POA: Diagnosis not present

## 2022-09-09 NOTE — Patient Instructions (Signed)

## 2022-09-11 LAB — CYTOLOGY - PAP: Diagnosis: NEGATIVE

## 2022-10-02 DIAGNOSIS — M2041 Other hammer toe(s) (acquired), right foot: Secondary | ICD-10-CM | POA: Diagnosis not present

## 2022-10-02 DIAGNOSIS — M79675 Pain in left toe(s): Secondary | ICD-10-CM | POA: Diagnosis not present

## 2022-10-02 DIAGNOSIS — M2042 Other hammer toe(s) (acquired), left foot: Secondary | ICD-10-CM | POA: Diagnosis not present

## 2022-10-02 DIAGNOSIS — L851 Acquired keratosis [keratoderma] palmaris et plantaris: Secondary | ICD-10-CM | POA: Diagnosis not present

## 2022-10-16 DIAGNOSIS — M170 Bilateral primary osteoarthritis of knee: Secondary | ICD-10-CM | POA: Diagnosis not present

## 2022-10-21 DIAGNOSIS — Z0000 Encounter for general adult medical examination without abnormal findings: Secondary | ICD-10-CM | POA: Diagnosis not present

## 2022-10-21 DIAGNOSIS — R634 Abnormal weight loss: Secondary | ICD-10-CM | POA: Diagnosis not present

## 2022-10-21 DIAGNOSIS — R636 Underweight: Secondary | ICD-10-CM | POA: Insufficient documentation

## 2022-10-21 DIAGNOSIS — R053 Chronic cough: Secondary | ICD-10-CM | POA: Diagnosis not present

## 2022-10-21 DIAGNOSIS — F5104 Psychophysiologic insomnia: Secondary | ICD-10-CM | POA: Diagnosis not present

## 2022-10-21 DIAGNOSIS — R739 Hyperglycemia, unspecified: Secondary | ICD-10-CM | POA: Diagnosis not present

## 2022-10-21 DIAGNOSIS — E785 Hyperlipidemia, unspecified: Secondary | ICD-10-CM | POA: Diagnosis not present

## 2022-10-21 DIAGNOSIS — K5909 Other constipation: Secondary | ICD-10-CM | POA: Diagnosis not present

## 2022-11-03 DIAGNOSIS — H04123 Dry eye syndrome of bilateral lacrimal glands: Secondary | ICD-10-CM | POA: Diagnosis not present

## 2022-11-11 DIAGNOSIS — R911 Solitary pulmonary nodule: Secondary | ICD-10-CM | POA: Diagnosis not present

## 2022-11-20 ENCOUNTER — Encounter: Payer: Self-pay | Admitting: Internal Medicine

## 2022-11-26 ENCOUNTER — Ambulatory Visit (AMBULATORY_SURGERY_CENTER): Payer: BC Managed Care – PPO | Admitting: *Deleted

## 2022-11-26 VITALS — Ht 61.0 in | Wt 110.0 lb

## 2022-11-26 DIAGNOSIS — Z86010 Personal history of colonic polyps: Secondary | ICD-10-CM

## 2022-11-26 MED ORDER — NA SULFATE-K SULFATE-MG SULF 17.5-3.13-1.6 GM/177ML PO SOLN: 1.0000 | Freq: Once | ORAL | 0 refills | Status: AC

## 2022-11-26 NOTE — Progress Notes (Signed)
Pt's name and DOB verified at the beginning of the pre-visit.  Pt denies any difficulty with ambulating,sitting, laying down or rolling side to side Gave both [facility] main # and MD on call # prior to instructions.  No egg or soy allergy known to patient  No issues known to pt with past sedation with any surgeries or procedures Pt has no issues moving head neck or swallowing No FH of Malignant Hyperthermia Pt is not on diet pills Pt is not on home 02  Pt is not on blood thinners  Pt denies issues with constipation  Pt has frequent issues with constipation RN instructed pt to use Miralax per bottles instructions a week before prep days. Pt states they will Pt is not on dialysis Pt denise any abnormal heart rhythms  Pt denies any upcoming cardiac testing Pt encouraged to use to use Singlecare or Goodrx to reduce cost  Patient's chart reviewed by Cathlyn Parsons CNRA prior to pre-visit and patient appropriate for the [facility].  Pre-visit completed and red dot placed by patient's name on their procedure day (on provider's schedule).  . Visit by phone Pt states weight is 110 lb Instructed pt why it is important to and  to call if they have any changes in health or new medications. Directed them to the # given and on instructions.   Pt states they will.  Instructions reviewed with pt and pt states understanding. Instructed to review again prior to procedure. Pt states they will.  Instructions sent by mail with coupon and by my chart

## 2022-11-28 ENCOUNTER — Encounter: Payer: Self-pay | Admitting: Internal Medicine

## 2022-12-14 DIAGNOSIS — H6122 Impacted cerumen, left ear: Secondary | ICD-10-CM | POA: Diagnosis not present

## 2022-12-14 DIAGNOSIS — S00411A Abrasion of right ear, initial encounter: Secondary | ICD-10-CM | POA: Diagnosis not present

## 2022-12-19 ENCOUNTER — Encounter: Payer: Self-pay | Admitting: Internal Medicine

## 2022-12-19 ENCOUNTER — Ambulatory Visit (AMBULATORY_SURGERY_CENTER): Payer: BC Managed Care – PPO | Admitting: Internal Medicine

## 2022-12-19 VITALS — BP 120/73 | HR 66 | Temp 97.7°F | Resp 11 | Ht 61.0 in | Wt 110.0 lb

## 2022-12-19 DIAGNOSIS — Z1211 Encounter for screening for malignant neoplasm of colon: Secondary | ICD-10-CM | POA: Diagnosis not present

## 2022-12-19 DIAGNOSIS — Z09 Encounter for follow-up examination after completed treatment for conditions other than malignant neoplasm: Secondary | ICD-10-CM

## 2022-12-19 DIAGNOSIS — D128 Benign neoplasm of rectum: Secondary | ICD-10-CM | POA: Diagnosis not present

## 2022-12-19 DIAGNOSIS — Z86010 Personal history of colonic polyps: Secondary | ICD-10-CM | POA: Diagnosis not present

## 2022-12-19 DIAGNOSIS — K621 Rectal polyp: Secondary | ICD-10-CM | POA: Diagnosis not present

## 2022-12-19 MED ORDER — SODIUM CHLORIDE 0.9 % IV SOLN: 500.0000 mL | INTRAVENOUS | Status: DC

## 2022-12-19 NOTE — Patient Instructions (Addendum)
There were 3 rectal polyps removed. Not sure they were pre-cancerous. I also took some biopsies of the rectum.  I will let you know results and make recommendations after that.   You are a chronic throat clearer and cougher! You are not alone! The causes of chronic throat clearing include acid reflux (laryngopharyngeal reflux), allergies, environmental irritants such as tobacco smoke and air pollution, and asthma. If present for a long time throat clearing can become habit forming. When you clear your throat, you are transferring mucus from your throat up into your mouth and nose. We all secrete up to 2 liters (imagine a big Coke bottle) of mucus a day. This saliva is usually swallowed and ends up in the toilet eventually. By clearing the mucus back into your mouth and nose you are sending the saliva in the wrong direction. This is counterproductive. Unless you are walking around spitting all day (which most throat clearers do not do), the mucus will work its way back down to the throat and eventually be swallowed. Get the mucus going in the right direction. Swallow! Swallow! Swallow! No throat clearing.  Chronic throat clearing is damaging. The trauma from the throat clearing can cause redness and swelling of your vocal cords. If the clearing is very excessive small growths (granulomas) can form. These granulomas can get so large that they can eventually affect your breathing. Surgical removal may be necessary. The irritation and swelling produced by the clearing can cause saliva to sit in your throat. This causes more throat clearing. More throat clearing causes more stagnant mucus which causes more throat clearing, which causes more mucus, etc... A vicious cycle will ensue and the habit can be very difficult to break. Without your help and a conscious effort on your part to break the cycle, the throat clearing will never stop.  Your doctor may prescribe medication and behavioral modifications to treat  acid reflux disease. Nose and throat sprays may be prescribed to treat underlying allergies or asthma. Avoiding possible irritants will be recommended. Without changes to your behavior these treatments will not be successful. The following alterations are recommended:  Do not clear your throat. Swallow instead. This gets the mucus going in the right direction towards the toilet. Carry around some water to assist with swallowing and mucus clearance. When you feel the urge to clear your throat take a sip of the water. If you absolutely need to clear your throat perform a non-traumatic throat clear. To do this pant with your mouth open and say "[place], [place], [place]" with a powerful but very breathy voice. This will clear the secretions without causing damage. Increase your water intake. This will thin secretions and make it easier to swallow. Comply with the behavior recommendations for reflux disease. Chew baking soda ([employer]) gum. This can be found on the internet or in the tooth paste isle of your pharmacy. Gum chewing can help with swallowing, reflux, and throat clearing. Chew three pieces a day. If you develop jaw discomfort or headaches decrease the amount of gum chewing. Tell your friends and family to tell you to swallow when you clear your throat. Some people have been clearing so long that they don't even know when they are doing it. Be patient. The urge to clear your throat will not go away overnight. It may take 8 or 12 weeks for the medication and behavior modifications to work.  Cassandra Frazier!   I appreciate the opportunity to care for you. Iva Boop,  MD, [facility]  Resume all of your current medications as ordered.  Read all of the handouts given to you by your recovery room nurse.   YOU HAD AN ENDOSCOPIC PROCEDURE TODAY AT THE [facility]:   Refer to the procedure report that was given to you for any specific questions about what was found during the examination.  If the  procedure report does not answer your questions, please call your gastroenterologist to clarify.  If you requested that your care partner not be given the details of your procedure findings, then the procedure report has been included in a sealed envelope for you to review at your convenience later.  YOU SHOULD EXPECT: Some feelings of bloating in the abdomen. Passage of more gas than usual.  Walking can help get rid of the air that was put into your GI tract during the procedure and reduce the bloating. If you had a lower endoscopy (such as a colonoscopy or flexible sigmoidoscopy) you may notice spotting of blood in your stool or on the toilet paper. If you underwent a bowel prep for your procedure, you may not have a normal bowel movement for a few days.  Please Note:  You might notice some irritation and congestion in your nose or some drainage.  This is from the oxygen used during your procedure.  There is no need for concern and it should clear up in a day or so.  SYMPTOMS TO REPORT IMMEDIATELY:  Following lower endoscopy (colonoscopy or flexible sigmoidoscopy):  Excessive amounts of blood in the stool  Significant tenderness or worsening of abdominal pains  Swelling of the abdomen that is new, acute  Fever of 100F or higher    For urgent or emergent issues, a gastroenterologist can be reached at any hour by calling [phone]. Do not use MyChart messaging for urgent concerns.    DIET:  We do recommend a small meal at first, but then you may proceed to your regular diet.  Drink plenty of fluids but you should avoid alcoholic beverages for 24 hours.  ACTIVITY:  You should plan to take it easy for the rest of today and you should NOT DRIVE or use heavy machinery until tomorrow (because of the sedation medicines used during the test).    FOLLOW UP: Our staff will call the number listed on your records the next business day following your procedure.  We will call around 7:15- 8:00 am to  check on you and address any questions or concerns that you may have regarding the information given to you following your procedure. If we do not reach you, we will leave a message.     If any biopsies were taken you will be contacted by phone or by letter within the next 1-3 weeks.  Please call us at [phone] if you have not heard about the biopsies in 3 weeks.    SIGNATURES/CONFIDENTIALITY: You and/or your care partner have signed paperwork which will be entered into your electronic medical record.  These signatures attest to the fact that that the information above on your After Visit Summary has been reviewed and is understood.  Full responsibility of the confidentiality of this discharge information lies with you and/or your care-partner.

## 2022-12-19 NOTE — Progress Notes (Signed)
Called to room to assist during endoscopic procedure.  Patient ID and intended procedure confirmed with present staff. Received instructions for my participation in the procedure from the performing physician.  

## 2022-12-19 NOTE — Progress Notes (Signed)
Sedate, gd SR, tolerated procedure well, VSS, report to RN 

## 2022-12-19 NOTE — Progress Notes (Signed)
Pt's states no medical or surgical changes since previsit or office visit. 

## 2022-12-19 NOTE — Op Note (Signed)
[facility] Patient Name: Cassandra Frazier Procedure Date: 12/19/2022 3:22 PM MRN: [id number] Endoscopist: Iva Boop , MD, [id number] Age: 63 Referring MD:  Date of Birth: Sep 28, 1960 Gender: Female Account #: [id number] Procedure:                Colonoscopy Indications:              Surveillance: Personal history of adenomatous                            polyps on last colonoscopy > 5 years ago, Last                            colonoscopy: 2016 Medicines:                Monitored Anesthesia Care Procedure:                Pre-Anesthesia Assessment:                           - Prior to the procedure, a History and Physical                            was performed, and patient medications and                            allergies were reviewed. The patient's tolerance of                            previous anesthesia was also reviewed. The risks                            and benefits of the procedure and the sedation                            options and risks were discussed with the patient.                            All questions were answered, and informed consent                            was obtained. Prior Anticoagulants: The patient has                            taken no anticoagulant or antiplatelet agents. ASA                            Grade Assessment: II - A patient with mild systemic                            disease. After reviewing the risks and benefits,                            the patient was deemed in satisfactory condition to  undergo the procedure.                           After obtaining informed consent, the colonoscope                            was passed under direct vision. Throughout the                            procedure, the patient's blood pressure, pulse, and                            oxygen saturations were monitored continuously. The                            CF [id number] #[id number] was introduced through the  anus                            and advanced to the the cecum, identified by                            appendiceal orifice and ileocecal valve. The                            colonoscopy was performed without difficulty. The                            patient tolerated the procedure well. The quality                            of the bowel preparation was good. The ileocecal                            valve, appendiceal orifice, and rectum were                            photographed. The bowel preparation used was SUPREP                            via split dose instruction. Scope In: 3:38:08 PM Scope Out: 3:54:50 PM Scope Withdrawal Time: 0 hours 12 minutes 44 seconds  Total Procedure Duration: 0 hours 16 minutes 42 seconds  Findings:                 The perianal and digital rectal examinations were                            normal.                           Three sessile polyps were found in the distal                            rectum. The polyps were diminutive in size. These  polyps were removed with a cold snare. Resection                            and retrieval were complete. Verification of                            patient identification for the specimen was done.                           A localized area of polypoid mucosa was found in                            the distal rectum. Biopsies were taken with a cold                            forceps for histology. Verification of patient                            identification for the specimen was done. Estimated                            blood loss was minimal.                           The exam was otherwise without abnormality on                            direct and retroflexion views. Complications:            No immediate complications. Estimated Blood Loss:     Estimated blood loss was minimal. Estimated blood                            loss was minimal. Impression:               - Three  diminutive polyps in the distal rectum,                            removed with a cold snare. Resected and retrieved.                           - Polypoid mucosa in the distal rectum. Biopsied.                            Underlying the diminutive polyps removed ? if this                            is prolapse effect in a patient w/ chronic                            constipation                           - The examination was otherwise normal on direct  and retroflexion views.                           - Personal history of colonic polyp - 2 mm rectal                            adenoma 2016 Recommendation:           - Patient has a contact number available for                            emergencies. The signs and symptoms of potential                            delayed complications were discussed with the                            patient. Return to normal activities tomorrow.                            Written discharge instructions were provided to the                            patient.                           - Resume previous diet.                           - Continue present medications.                           - Await pathology results.                           - Repeat colonoscopy is recommended. The                            colonoscopy date will be determined after pathology                            results from today's exam become available for                            review. Iva Boop, MD 12/19/2022 4:03:07 PM This report has been signed electronically. Sanjuan Dame, MD

## 2022-12-19 NOTE — Progress Notes (Signed)
[facility] History and Physical   Primary Care Physician:  Pomposini, Rande Brunt, MD   Reason for Procedure:   Hx rectal adenoma  Plan:    colonoscopy     HPI: Cassandra Frazier is a 62 y.o. female s/p removal of diminutive adenoma 2016   Past Medical History:  Diagnosis Date   Acute right lumbar radiculopathy    ANA positive    Chronic constipation    Chronic cough    Chronic insomnia    Cough    Origin unknown seen several MD's differnent types no diagnosis as to why comes in spurts   COVID-19    Positive antibodies in 2021 suspected infection spring 2021   Degeneration of lumbar intervertebral disc    Herniated lumbar intervertebral disc    Hx of adenomatous polyp of rectum 06/28/2014   Hyperlipidemia    diet controlled   Kidney stones    Osteoarthritis    Osteoporosis     Past Surgical History:  Procedure Laterality Date   BREAST ENHANCEMENT SURGERY  2009   COLONOSCOPY  2016   CG-MAC-movi(good)-rectal polyp   epidural steroid injection     multiple   ESOPHAGOGASTRODUODENOSCOPY  12/25/2020   TONSILLECTOMY     UPPER GASTROINTESTINAL ENDOSCOPY     VULVA SURGERY  2011   labial plasty   WISDOM TOOTH EXTRACTION      Prior to Admission medications   Medication Sig Start Date End Date Taking? Authorizing Provider  acetaminophen (TYLENOL) 325 MG tablet    Yes [provider]  Glycerin, Laxative, (FLEET LIQUID GLYCERIN SUPP RE) Place 15 suppositories rectally daily.   Yes [provider]  rosuvastatin (CRESTOR) 10 MG tablet Take 10 mg by mouth at bedtime. 11/20/22  Yes [provider]  Pseudoeph-Doxylamine-DM-APAP (NYQUIL PO) Take by mouth as needed. Patient not taking: Reported on 09/09/2022    [provider]    Current Outpatient Medications  Medication Sig Dispense Refill   acetaminophen (TYLENOL) 325 MG tablet      Glycerin, Laxative, (FLEET LIQUID GLYCERIN SUPP RE) Place 15 suppositories rectally daily.      rosuvastatin (CRESTOR) 10 MG tablet Take 10 mg by mouth at bedtime.     Pseudoeph-Doxylamine-DM-APAP (NYQUIL PO) Take by mouth as needed. (Patient not taking: Reported on 09/09/2022)     Current Facility-Administered Medications  Medication Dose Route Frequency Provider Last Rate Last Admin   0.9 %  sodium chloride infusion  500 mL Intravenous Continuous Iva Boop, MD        Allergies as of 12/19/2022   (No Known Allergies)    Family History  Problem Relation Age of Onset   Thyroid disease Mother    Pancreatitis Mother        secondary to EGD perforation   Osteoarthritis Mother    ALS Father    Osteoarthritis Sister    Osteoarthritis Brother    Osteoarthritis Brother    Healthy Son    Diabetes Neg Hx    Colon cancer Neg Hx    Colon polyps Neg Hx    Kidney disease Neg Hx    Esophageal cancer Neg Hx    Stomach cancer Neg Hx    Rectal cancer Neg Hx     Social History   Socioeconomic History   Marital status: Married    Spouse name: Not on file   Number of children: 1   Years of education: Not on file   Highest education level: Not on file  Occupational History   Occupation: Web designer  Tobacco Use   Smoking status: Never   Smokeless tobacco: Never  Vaping Use   Vaping status: Never Used  Substance and Sexual Activity   Alcohol use: Not Currently    Alcohol/week: 7.0 standard drinks of alcohol    Types: 7 Glasses of wine per week   Drug use: No   Sexual activity: Not Currently    Birth control/protection: Post-menopausal  Other Topics Concern   Not on file  Social History Narrative   Married, employed as a Firefighter, 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  Physical Exam: Vital signs BP 124/80   Pulse 62   Temp 97.7 F (36.5 C)   Ht 5\' 1"  (1.549 m)   Wt 110 lb (49.9 kg)   LMP 04/04/2014   SpO2 96%   BMI 20.78 kg/m   General:   Alert,  Well-developed, well-nourished, pleasant and cooperative in NAD Lungs:  Clear throughout to auscultation.   Heart:  Regular rate and rhythm; no murmurs, clicks, rubs,  or gallops. Abdomen:  Soft, nontender and nondistended. Normal bowel sounds.   Neuro/Psych:  Alert and cooperative. Normal mood and affect. A and O x 3   @Ryelynn Guedea  Sena Slate, MD, [facility] [phone] (pager) 12/19/2022 3:29 PM@

## 2022-12-22 ENCOUNTER — Telehealth: Payer: Self-pay | Admitting: *Deleted

## 2022-12-22 NOTE — Telephone Encounter (Signed)
No answer on  follow up call. Left message.   

## 2022-12-30 ENCOUNTER — Encounter: Payer: Self-pay | Admitting: Internal Medicine

## 2023-01-14 ENCOUNTER — Other Ambulatory Visit: Payer: Self-pay | Admitting: *Deleted

## 2023-02-02 DIAGNOSIS — R911 Solitary pulmonary nodule: Secondary | ICD-10-CM | POA: Diagnosis not present

## 2023-02-02 DIAGNOSIS — E785 Hyperlipidemia, unspecified: Secondary | ICD-10-CM | POA: Diagnosis not present

## 2023-02-02 DIAGNOSIS — R053 Chronic cough: Secondary | ICD-10-CM | POA: Diagnosis not present

## 2023-02-06 DIAGNOSIS — M170 Bilateral primary osteoarthritis of knee: Secondary | ICD-10-CM | POA: Diagnosis not present

## 2023-02-23 DIAGNOSIS — R17 Unspecified jaundice: Secondary | ICD-10-CM | POA: Diagnosis not present

## 2023-03-05 DIAGNOSIS — M7122 Synovial cyst of popliteal space [Baker], left knee: Secondary | ICD-10-CM | POA: Diagnosis not present

## 2023-05-07 DIAGNOSIS — M170 Bilateral primary osteoarthritis of knee: Secondary | ICD-10-CM | POA: Diagnosis not present

## 2023-06-02 DIAGNOSIS — J209 Acute bronchitis, unspecified: Secondary | ICD-10-CM | POA: Diagnosis not present

## 2023-06-02 DIAGNOSIS — R059 Cough, unspecified: Secondary | ICD-10-CM | POA: Diagnosis not present

## 2023-06-02 DIAGNOSIS — J020 Streptococcal pharyngitis: Secondary | ICD-10-CM | POA: Diagnosis not present

## 2023-06-02 DIAGNOSIS — Z0130 Encounter for examination of blood pressure without abnormal findings: Secondary | ICD-10-CM | POA: Diagnosis not present

## 2023-06-03 DIAGNOSIS — J209 Acute bronchitis, unspecified: Secondary | ICD-10-CM | POA: Diagnosis not present

## 2023-06-03 DIAGNOSIS — Z0130 Encounter for examination of blood pressure without abnormal findings: Secondary | ICD-10-CM | POA: Diagnosis not present

## 2023-06-05 DIAGNOSIS — Z03818 Encounter for observation for suspected exposure to other biological agents ruled out: Secondary | ICD-10-CM | POA: Diagnosis not present

## 2023-06-05 DIAGNOSIS — R509 Fever, unspecified: Secondary | ICD-10-CM | POA: Diagnosis not present

## 2023-06-05 DIAGNOSIS — J101 Influenza due to other identified influenza virus with other respiratory manifestations: Secondary | ICD-10-CM | POA: Diagnosis not present

## 2023-06-05 DIAGNOSIS — R0981 Nasal congestion: Secondary | ICD-10-CM | POA: Diagnosis not present

## 2023-06-05 DIAGNOSIS — J020 Streptococcal pharyngitis: Secondary | ICD-10-CM | POA: Diagnosis not present

## 2023-06-05 DIAGNOSIS — Z1152 Encounter for screening for COVID-19: Secondary | ICD-10-CM | POA: Diagnosis not present

## 2023-06-05 DIAGNOSIS — R059 Cough, unspecified: Secondary | ICD-10-CM | POA: Diagnosis not present

## 2023-06-12 DIAGNOSIS — Z1231 Encounter for screening mammogram for malignant neoplasm of breast: Secondary | ICD-10-CM | POA: Diagnosis not present

## 2023-06-17 ENCOUNTER — Encounter: Payer: Self-pay | Admitting: Obstetrics and Gynecology

## 2023-08-13 DIAGNOSIS — M170 Bilateral primary osteoarthritis of knee: Secondary | ICD-10-CM | POA: Diagnosis not present

## 2023-10-13 DIAGNOSIS — E559 Vitamin D deficiency, unspecified: Secondary | ICD-10-CM | POA: Diagnosis not present

## 2023-10-13 DIAGNOSIS — R5383 Other fatigue: Secondary | ICD-10-CM | POA: Diagnosis not present

## 2023-10-13 DIAGNOSIS — E538 Deficiency of other specified B group vitamins: Secondary | ICD-10-CM | POA: Diagnosis not present

## 2023-10-13 DIAGNOSIS — E782 Mixed hyperlipidemia: Secondary | ICD-10-CM | POA: Diagnosis not present

## 2023-10-21 DIAGNOSIS — E782 Mixed hyperlipidemia: Secondary | ICD-10-CM | POA: Diagnosis not present

## 2023-10-21 DIAGNOSIS — M47814 Spondylosis without myelopathy or radiculopathy, thoracic region: Secondary | ICD-10-CM | POA: Diagnosis not present

## 2023-10-21 DIAGNOSIS — M419 Scoliosis, unspecified: Secondary | ICD-10-CM | POA: Diagnosis not present

## 2023-10-21 DIAGNOSIS — M5416 Radiculopathy, lumbar region: Secondary | ICD-10-CM | POA: Diagnosis not present

## 2023-11-03 NOTE — Progress Notes (Unsigned)
 63 y.o. G25P0011 Married Caucasian female here for annual exam.    PCP: Pomposini, Amiel Balder, MD   Patient's last menstrual period was 04/04/2014.           Sexually active: No.  The current method of family planning is post menopausal status.    Menopausal hormone therapy:  n/a Exercising: {yes no:314532}  {types:19826} Smoker:  no  OB History  Gravida Para Term Preterm AB Living  2 1   1 1   SAB IAB Ectopic Multiple Live Births  1        # Outcome Date GA Lbr Len/2nd Weight Sex Type Anes PTL Lv  2 Para           1 SAB              HEALTH MAINTENANCE: Last 2 paps:  09/09/22 neg History of abnormal Pap or positive HPV:  no Mammogram:   06/12/23 Breast Density Cat C, BIRADS Cat 2 benign  Colonoscopy:  12/19/22 Bone Density:  01/11/21  Result  osteopenia    Immunization History  Administered Date(s) Administered   Moderna Sars-Covid-2 Vaccination 01/13/2020, 02/10/2020      reports that she has never smoked. She has never used smokeless tobacco. She reports that she does not currently use alcohol after a past usage of about 7.0 standard drinks of alcohol per week. She reports that she does not use drugs.  Past Medical History:  Diagnosis Date   Acute right lumbar radiculopathy    ANA positive    Chronic constipation    Chronic cough    Chronic insomnia    Cough    Origin unknown seen several MD's differnent types no diagnosis as to why comes in spurts   COVID-19    Positive antibodies in 2021 suspected infection spring 2021   Degeneration of lumbar intervertebral disc    Herniated lumbar intervertebral disc    Hx of adenomatous polyp of rectum 06/28/2014   Hyperlipidemia    diet controlled   Kidney stones    Osteoarthritis    Osteoporosis     Past Surgical History:  Procedure Laterality Date   BREAST ENHANCEMENT SURGERY  2009   COLONOSCOPY  2016   CG-MAC-movi(good)-rectal polyp   epidural steroid injection     multiple   ESOPHAGOGASTRODUODENOSCOPY   12/25/2020   TONSILLECTOMY     UPPER GASTROINTESTINAL ENDOSCOPY     VULVA SURGERY  2011   labial plasty   WISDOM TOOTH EXTRACTION      Current Outpatient Medications  Medication Sig Dispense Refill   acetaminophen (TYLENOL) 325 MG tablet      Glycerin, Laxative, (FLEET LIQUID GLYCERIN SUPP RE) Place 15 suppositories rectally daily.     Pseudoeph-Doxylamine-DM-APAP (NYQUIL PO) Take by mouth as needed. (Patient not taking: Reported on 09/09/2022)     rosuvastatin (CRESTOR) 10 MG tablet Take 10 mg by mouth at bedtime.     No current facility-administered medications for this visit.    ALLERGIES: Patient has no known allergies.  Family History  Problem Relation Age of Onset   Thyroid  disease Mother    Pancreatitis Mother        secondary to EGD perforation   Osteoarthritis Mother    ALS Father    Osteoarthritis Sister    Osteoarthritis Brother    Osteoarthritis Brother    Healthy Son    Diabetes Neg Hx    Colon cancer Neg Hx    Colon polyps Neg Hx  Kidney disease Neg Hx    Esophageal cancer Neg Hx    Stomach cancer Neg Hx    Rectal cancer Neg Hx     Review of Systems  PHYSICAL EXAM:  LMP 04/04/2014     General appearance: alert, cooperative and appears stated age Head: normocephalic, without obvious abnormality, atraumatic Neck: no adenopathy, supple, symmetrical, trachea midline and thyroid  normal to inspection and palpation Lungs: clear to auscultation bilaterally Breasts: normal appearance, no masses or tenderness, No nipple retraction or dimpling, No nipple discharge or bleeding, No axillary adenopathy Heart: regular rate and rhythm Abdomen: soft, non-tender; no masses, no organomegaly Extremities: extremities normal, atraumatic, no cyanosis or edema Skin: skin color, texture, turgor normal. No rashes or lesions Lymph nodes: cervical, supraclavicular, and axillary nodes normal. Neurologic: grossly normal  Pelvic: External genitalia:  no lesions               No abnormal inguinal nodes palpated.              Urethra:  normal appearing urethra with no masses, tenderness or lesions              Bartholins and Skenes: normal                 Vagina: normal appearing vagina with normal color and discharge, no lesions              Cervix: no lesions              Pap taken: {yes no:314532} Bimanual Exam:  Uterus:  normal size, contour, position, consistency, mobility, non-tender              Adnexa: no mass, fullness, tenderness              Rectal exam: {yes no:314532}.  Confirms.              Anus:  normal sphincter tone, no lesions  Chaperone was present for exam:  {BSCHAPERONE:31226::"Emily F, CMA"}  ASSESSMENT: Well woman visit with gynecologic exam.  PHQ-9: ***  ***  PLAN: Mammogram screening discussed. Self breast awareness reviewed. Pap and HRV collected:  {yes no:314532} Guidelines for Calcium, Vitamin D, regular exercise program including cardiovascular and weight bearing exercise. Medication refills:  *** {LABS (Optional):23779} Follow up:  ***    Additional counseling given.  {yes [id number]. ***  total time was spent for this patient encounter, including preparation, face-to-face counseling with the patient, coordination of care, and documentation of the encounter in addition to doing the well woman visit with gynecologic exam.

## 2023-11-04 ENCOUNTER — Ambulatory Visit (INDEPENDENT_AMBULATORY_CARE_PROVIDER_SITE_OTHER): Payer: BC Managed Care – PPO | Admitting: Obstetrics and Gynecology

## 2023-11-04 ENCOUNTER — Encounter: Payer: Self-pay | Admitting: Obstetrics and Gynecology

## 2023-11-04 VITALS — BP 124/82 | HR 67 | Ht 62.0 in | Wt 117.0 lb

## 2023-11-04 DIAGNOSIS — Z780 Asymptomatic menopausal state: Secondary | ICD-10-CM

## 2023-11-04 DIAGNOSIS — M8589 Other specified disorders of bone density and structure, multiple sites: Secondary | ICD-10-CM | POA: Diagnosis not present

## 2023-11-04 DIAGNOSIS — Z1331 Encounter for screening for depression: Secondary | ICD-10-CM | POA: Diagnosis not present

## 2023-11-04 DIAGNOSIS — Z01419 Encounter for gynecological examination (general) (routine) without abnormal findings: Secondary | ICD-10-CM

## 2023-11-04 NOTE — Patient Instructions (Signed)

## 2023-11-05 DIAGNOSIS — Z1283 Encounter for screening for malignant neoplasm of skin: Secondary | ICD-10-CM | POA: Diagnosis not present

## 2023-11-05 DIAGNOSIS — D225 Melanocytic nevi of trunk: Secondary | ICD-10-CM | POA: Diagnosis not present

## 2023-11-05 DIAGNOSIS — L821 Other seborrheic keratosis: Secondary | ICD-10-CM | POA: Diagnosis not present

## 2023-11-05 DIAGNOSIS — L820 Inflamed seborrheic keratosis: Secondary | ICD-10-CM | POA: Diagnosis not present

## 2023-11-11 DIAGNOSIS — M8588 Other specified disorders of bone density and structure, other site: Secondary | ICD-10-CM | POA: Diagnosis not present

## 2023-11-11 DIAGNOSIS — R29890 Loss of height: Secondary | ICD-10-CM | POA: Diagnosis not present

## 2023-11-11 LAB — HM DEXA SCAN

## 2023-11-19 ENCOUNTER — Ambulatory Visit: Payer: Self-pay | Admitting: Obstetrics and Gynecology

## 2023-11-30 DIAGNOSIS — M170 Bilateral primary osteoarthritis of knee: Secondary | ICD-10-CM | POA: Diagnosis not present

## 2023-12-02 DIAGNOSIS — M545 Low back pain, unspecified: Secondary | ICD-10-CM | POA: Diagnosis not present

## 2023-12-07 DIAGNOSIS — M47816 Spondylosis without myelopathy or radiculopathy, lumbar region: Secondary | ICD-10-CM | POA: Diagnosis not present

## 2023-12-07 DIAGNOSIS — M533 Sacrococcygeal disorders, not elsewhere classified: Secondary | ICD-10-CM | POA: Diagnosis not present

## 2023-12-24 DIAGNOSIS — M533 Sacrococcygeal disorders, not elsewhere classified: Secondary | ICD-10-CM | POA: Diagnosis not present

## 2024-01-22 DIAGNOSIS — M545 Low back pain, unspecified: Secondary | ICD-10-CM | POA: Diagnosis not present

## 2024-01-27 DIAGNOSIS — M5416 Radiculopathy, lumbar region: Secondary | ICD-10-CM | POA: Diagnosis not present

## 2024-01-27 DIAGNOSIS — M47816 Spondylosis without myelopathy or radiculopathy, lumbar region: Secondary | ICD-10-CM | POA: Diagnosis not present

## 2024-01-27 DIAGNOSIS — M533 Sacrococcygeal disorders, not elsewhere classified: Secondary | ICD-10-CM | POA: Diagnosis not present

## 2024-02-03 DIAGNOSIS — M5416 Radiculopathy, lumbar region: Secondary | ICD-10-CM | POA: Diagnosis not present

## 2024-02-26 DIAGNOSIS — M179 Osteoarthritis of knee, unspecified: Secondary | ICD-10-CM | POA: Diagnosis not present

## 2024-02-26 DIAGNOSIS — M7070 Other bursitis of hip, unspecified hip: Secondary | ICD-10-CM | POA: Diagnosis not present

## 2024-02-26 DIAGNOSIS — M5416 Radiculopathy, lumbar region: Secondary | ICD-10-CM | POA: Diagnosis not present

## 2024-02-26 DIAGNOSIS — M7918 Myalgia, other site: Secondary | ICD-10-CM | POA: Diagnosis not present

## 2024-04-06 DIAGNOSIS — M170 Bilateral primary osteoarthritis of knee: Secondary | ICD-10-CM | POA: Diagnosis not present

## 2024-04-06 DIAGNOSIS — M1711 Unilateral primary osteoarthritis, right knee: Secondary | ICD-10-CM | POA: Diagnosis not present

## 2024-04-15 DIAGNOSIS — R7309 Other abnormal glucose: Secondary | ICD-10-CM | POA: Diagnosis not present

## 2024-04-15 DIAGNOSIS — R5383 Other fatigue: Secondary | ICD-10-CM | POA: Diagnosis not present

## 2024-04-15 DIAGNOSIS — R911 Solitary pulmonary nodule: Secondary | ICD-10-CM | POA: Diagnosis not present

## 2024-04-15 DIAGNOSIS — E782 Mixed hyperlipidemia: Secondary | ICD-10-CM | POA: Diagnosis not present

## 2024-04-27 DIAGNOSIS — R911 Solitary pulmonary nodule: Secondary | ICD-10-CM | POA: Diagnosis not present

## 2024-04-29 DIAGNOSIS — M47814 Spondylosis without myelopathy or radiculopathy, thoracic region: Secondary | ICD-10-CM | POA: Diagnosis not present

## 2024-04-29 DIAGNOSIS — K257 Chronic gastric ulcer without hemorrhage or perforation: Secondary | ICD-10-CM | POA: Diagnosis not present

## 2024-04-29 DIAGNOSIS — K5909 Other constipation: Secondary | ICD-10-CM | POA: Diagnosis not present

## 2024-04-29 DIAGNOSIS — M419 Scoliosis, unspecified: Secondary | ICD-10-CM | POA: Diagnosis not present

## 2024-05-03 DIAGNOSIS — M4155 Other secondary scoliosis, thoracolumbar region: Secondary | ICD-10-CM | POA: Diagnosis not present

## 2024-05-03 DIAGNOSIS — M4316 Spondylolisthesis, lumbar region: Secondary | ICD-10-CM | POA: Diagnosis not present

## 2024-05-03 DIAGNOSIS — M25862 Other specified joint disorders, left knee: Secondary | ICD-10-CM | POA: Diagnosis not present

## 2024-05-03 DIAGNOSIS — M5030 Other cervical disc degeneration, unspecified cervical region: Secondary | ICD-10-CM | POA: Diagnosis not present

## 2024-05-03 DIAGNOSIS — M25861 Other specified joint disorders, right knee: Secondary | ICD-10-CM | POA: Diagnosis not present

## 2024-05-03 DIAGNOSIS — M533 Sacrococcygeal disorders, not elsewhere classified: Secondary | ICD-10-CM | POA: Diagnosis not present

## 2024-05-03 DIAGNOSIS — M4312 Spondylolisthesis, cervical region: Secondary | ICD-10-CM | POA: Diagnosis not present

## 2024-06-17 LAB — HM MAMMOGRAPHY

## 2024-06-20 ENCOUNTER — Encounter: Payer: Self-pay | Admitting: Obstetrics and Gynecology

## 2024-06-21 ENCOUNTER — Ambulatory Visit: Payer: Self-pay | Admitting: Obstetrics and Gynecology

## 2024-11-08 ENCOUNTER — Ambulatory Visit: Admitting: Obstetrics and Gynecology
